# Patient Record
Sex: Female | Born: 1935 | Race: White | Hispanic: No | Marital: Single | Smoking: Never smoker
Health system: Southern US, Community
[De-identification: ages and names within clinical notes are randomized; demographics above are authoritative.]

## PROBLEM LIST (undated history)

## (undated) DIAGNOSIS — K219 Gastro-esophageal reflux disease without esophagitis: Secondary | ICD-10-CM

## (undated) DIAGNOSIS — F0390 Unspecified dementia without behavioral disturbance: Secondary | ICD-10-CM

---

## 2003-08-16 ENCOUNTER — Emergency Department (HOSPITAL_COMMUNITY): Admission: EM | Admit: 2003-08-16 | Discharge: 2003-08-16 | Payer: Self-pay | Admitting: Emergency Medicine

## 2010-09-10 ENCOUNTER — Ambulatory Visit (INDEPENDENT_AMBULATORY_CARE_PROVIDER_SITE_OTHER): Payer: Medicare Other | Admitting: Otolaryngology

## 2010-09-10 DIAGNOSIS — H6980 Other specified disorders of Eustachian tube, unspecified ear: Secondary | ICD-10-CM

## 2010-09-10 DIAGNOSIS — H902 Conductive hearing loss, unspecified: Secondary | ICD-10-CM

## 2010-09-10 DIAGNOSIS — J343 Hypertrophy of nasal turbinates: Secondary | ICD-10-CM

## 2010-09-10 DIAGNOSIS — J310 Chronic rhinitis: Secondary | ICD-10-CM

## 2010-09-10 DIAGNOSIS — H6520 Chronic serous otitis media, unspecified ear: Secondary | ICD-10-CM

## 2011-03-17 DIAGNOSIS — M79609 Pain in unspecified limb: Secondary | ICD-10-CM | POA: Diagnosis not present

## 2011-03-17 DIAGNOSIS — B351 Tinea unguium: Secondary | ICD-10-CM | POA: Diagnosis not present

## 2011-05-26 DIAGNOSIS — B351 Tinea unguium: Secondary | ICD-10-CM | POA: Diagnosis not present

## 2011-05-26 DIAGNOSIS — M79609 Pain in unspecified limb: Secondary | ICD-10-CM | POA: Diagnosis not present

## 2011-06-22 DIAGNOSIS — J309 Allergic rhinitis, unspecified: Secondary | ICD-10-CM | POA: Diagnosis not present

## 2011-07-13 DIAGNOSIS — N39498 Other specified urinary incontinence: Secondary | ICD-10-CM | POA: Diagnosis not present

## 2011-08-03 DIAGNOSIS — B351 Tinea unguium: Secondary | ICD-10-CM | POA: Diagnosis not present

## 2011-08-03 DIAGNOSIS — M79609 Pain in unspecified limb: Secondary | ICD-10-CM | POA: Diagnosis not present

## 2011-08-30 DIAGNOSIS — N39498 Other specified urinary incontinence: Secondary | ICD-10-CM | POA: Diagnosis not present

## 2011-09-21 DIAGNOSIS — F0280 Dementia in other diseases classified elsewhere without behavioral disturbance: Secondary | ICD-10-CM | POA: Diagnosis not present

## 2011-09-21 DIAGNOSIS — D649 Anemia, unspecified: Secondary | ICD-10-CM | POA: Diagnosis not present

## 2011-09-21 DIAGNOSIS — E7800 Pure hypercholesterolemia, unspecified: Secondary | ICD-10-CM | POA: Diagnosis not present

## 2011-09-21 DIAGNOSIS — N39498 Other specified urinary incontinence: Secondary | ICD-10-CM | POA: Diagnosis not present

## 2011-10-05 DIAGNOSIS — M79609 Pain in unspecified limb: Secondary | ICD-10-CM | POA: Diagnosis not present

## 2011-10-05 DIAGNOSIS — B351 Tinea unguium: Secondary | ICD-10-CM | POA: Diagnosis not present

## 2011-10-26 DIAGNOSIS — N39498 Other specified urinary incontinence: Secondary | ICD-10-CM | POA: Diagnosis not present

## 2011-11-12 DIAGNOSIS — Z23 Encounter for immunization: Secondary | ICD-10-CM | POA: Diagnosis not present

## 2011-11-19 DIAGNOSIS — N39498 Other specified urinary incontinence: Secondary | ICD-10-CM | POA: Diagnosis not present

## 2011-12-13 DIAGNOSIS — N39498 Other specified urinary incontinence: Secondary | ICD-10-CM | POA: Diagnosis not present

## 2011-12-13 DIAGNOSIS — F0280 Dementia in other diseases classified elsewhere without behavioral disturbance: Secondary | ICD-10-CM | POA: Diagnosis not present

## 2011-12-16 DIAGNOSIS — M79609 Pain in unspecified limb: Secondary | ICD-10-CM | POA: Diagnosis not present

## 2011-12-16 DIAGNOSIS — B351 Tinea unguium: Secondary | ICD-10-CM | POA: Diagnosis not present

## 2011-12-23 DIAGNOSIS — L298 Other pruritus: Secondary | ICD-10-CM | POA: Diagnosis not present

## 2011-12-23 DIAGNOSIS — E119 Type 2 diabetes mellitus without complications: Secondary | ICD-10-CM | POA: Diagnosis not present

## 2012-02-10 DIAGNOSIS — N39498 Other specified urinary incontinence: Secondary | ICD-10-CM | POA: Diagnosis not present

## 2012-03-02 DIAGNOSIS — B351 Tinea unguium: Secondary | ICD-10-CM | POA: Diagnosis not present

## 2012-03-02 DIAGNOSIS — M79609 Pain in unspecified limb: Secondary | ICD-10-CM | POA: Diagnosis not present

## 2012-03-13 DIAGNOSIS — R32 Unspecified urinary incontinence: Secondary | ICD-10-CM | POA: Diagnosis not present

## 2012-03-23 DIAGNOSIS — L259 Unspecified contact dermatitis, unspecified cause: Secondary | ICD-10-CM | POA: Diagnosis not present

## 2012-04-04 DIAGNOSIS — F0280 Dementia in other diseases classified elsewhere without behavioral disturbance: Secondary | ICD-10-CM | POA: Diagnosis not present

## 2012-04-04 DIAGNOSIS — N390 Urinary tract infection, site not specified: Secondary | ICD-10-CM | POA: Diagnosis not present

## 2012-04-14 DIAGNOSIS — N39498 Other specified urinary incontinence: Secondary | ICD-10-CM | POA: Diagnosis not present

## 2012-05-15 DIAGNOSIS — B351 Tinea unguium: Secondary | ICD-10-CM | POA: Diagnosis not present

## 2012-05-15 DIAGNOSIS — M79609 Pain in unspecified limb: Secondary | ICD-10-CM | POA: Diagnosis not present

## 2012-06-22 DIAGNOSIS — F0280 Dementia in other diseases classified elsewhere without behavioral disturbance: Secondary | ICD-10-CM | POA: Diagnosis not present

## 2012-06-22 DIAGNOSIS — N39498 Other specified urinary incontinence: Secondary | ICD-10-CM | POA: Diagnosis not present

## 2012-07-06 DIAGNOSIS — L218 Other seborrheic dermatitis: Secondary | ICD-10-CM | POA: Diagnosis not present

## 2012-08-02 DIAGNOSIS — M79609 Pain in unspecified limb: Secondary | ICD-10-CM | POA: Diagnosis not present

## 2012-08-02 DIAGNOSIS — B351 Tinea unguium: Secondary | ICD-10-CM | POA: Diagnosis not present

## 2012-09-22 DIAGNOSIS — F0280 Dementia in other diseases classified elsewhere without behavioral disturbance: Secondary | ICD-10-CM | POA: Diagnosis not present

## 2012-09-22 DIAGNOSIS — N39498 Other specified urinary incontinence: Secondary | ICD-10-CM | POA: Diagnosis not present

## 2012-09-22 DIAGNOSIS — F410 Panic disorder [episodic paroxysmal anxiety] without agoraphobia: Secondary | ICD-10-CM | POA: Diagnosis not present

## 2012-10-18 DIAGNOSIS — M79609 Pain in unspecified limb: Secondary | ICD-10-CM | POA: Diagnosis not present

## 2012-10-18 DIAGNOSIS — B351 Tinea unguium: Secondary | ICD-10-CM | POA: Diagnosis not present

## 2012-11-03 DIAGNOSIS — D3130 Benign neoplasm of unspecified choroid: Secondary | ICD-10-CM | POA: Diagnosis not present

## 2012-11-03 DIAGNOSIS — H52229 Regular astigmatism, unspecified eye: Secondary | ICD-10-CM | POA: Diagnosis not present

## 2012-11-03 DIAGNOSIS — H2510 Age-related nuclear cataract, unspecified eye: Secondary | ICD-10-CM | POA: Diagnosis not present

## 2012-11-03 DIAGNOSIS — H5200 Hypermetropia, unspecified eye: Secondary | ICD-10-CM | POA: Diagnosis not present

## 2012-12-29 DIAGNOSIS — N39498 Other specified urinary incontinence: Secondary | ICD-10-CM | POA: Diagnosis not present

## 2012-12-29 DIAGNOSIS — F0280 Dementia in other diseases classified elsewhere without behavioral disturbance: Secondary | ICD-10-CM | POA: Diagnosis not present

## 2013-01-05 DIAGNOSIS — M79609 Pain in unspecified limb: Secondary | ICD-10-CM | POA: Diagnosis not present

## 2013-01-05 DIAGNOSIS — B351 Tinea unguium: Secondary | ICD-10-CM | POA: Diagnosis not present

## 2013-01-31 DIAGNOSIS — R32 Unspecified urinary incontinence: Secondary | ICD-10-CM | POA: Diagnosis not present

## 2013-02-26 DIAGNOSIS — N39498 Other specified urinary incontinence: Secondary | ICD-10-CM | POA: Diagnosis not present

## 2013-04-05 DIAGNOSIS — F0280 Dementia in other diseases classified elsewhere without behavioral disturbance: Secondary | ICD-10-CM | POA: Diagnosis not present

## 2013-04-05 DIAGNOSIS — N39498 Other specified urinary incontinence: Secondary | ICD-10-CM | POA: Diagnosis not present

## 2013-04-11 DIAGNOSIS — B351 Tinea unguium: Secondary | ICD-10-CM | POA: Diagnosis not present

## 2013-04-11 DIAGNOSIS — M79609 Pain in unspecified limb: Secondary | ICD-10-CM | POA: Diagnosis not present

## 2013-04-24 DIAGNOSIS — N39498 Other specified urinary incontinence: Secondary | ICD-10-CM | POA: Diagnosis not present

## 2013-07-04 DIAGNOSIS — N39498 Other specified urinary incontinence: Secondary | ICD-10-CM | POA: Diagnosis not present

## 2013-07-05 DIAGNOSIS — L259 Unspecified contact dermatitis, unspecified cause: Secondary | ICD-10-CM | POA: Diagnosis not present

## 2013-07-05 DIAGNOSIS — F0280 Dementia in other diseases classified elsewhere without behavioral disturbance: Secondary | ICD-10-CM | POA: Diagnosis not present

## 2013-07-09 DIAGNOSIS — M79609 Pain in unspecified limb: Secondary | ICD-10-CM | POA: Diagnosis not present

## 2013-07-09 DIAGNOSIS — B351 Tinea unguium: Secondary | ICD-10-CM | POA: Diagnosis not present

## 2013-07-10 DIAGNOSIS — IMO0002 Reserved for concepts with insufficient information to code with codable children: Secondary | ICD-10-CM | POA: Diagnosis not present

## 2013-07-12 DIAGNOSIS — F0280 Dementia in other diseases classified elsewhere without behavioral disturbance: Secondary | ICD-10-CM | POA: Diagnosis not present

## 2013-07-12 DIAGNOSIS — H268 Other specified cataract: Secondary | ICD-10-CM | POA: Diagnosis not present

## 2013-08-28 DIAGNOSIS — H2510 Age-related nuclear cataract, unspecified eye: Secondary | ICD-10-CM | POA: Diagnosis not present

## 2013-08-31 ENCOUNTER — Other Ambulatory Visit (HOSPITAL_COMMUNITY): Payer: Medicare Other

## 2013-09-12 ENCOUNTER — Other Ambulatory Visit (HOSPITAL_COMMUNITY): Payer: Medicare Other

## 2013-09-13 DIAGNOSIS — M79609 Pain in unspecified limb: Secondary | ICD-10-CM | POA: Diagnosis not present

## 2013-09-13 DIAGNOSIS — B351 Tinea unguium: Secondary | ICD-10-CM | POA: Diagnosis not present

## 2013-09-17 DIAGNOSIS — N39498 Other specified urinary incontinence: Secondary | ICD-10-CM | POA: Diagnosis not present

## 2013-09-26 ENCOUNTER — Other Ambulatory Visit (HOSPITAL_COMMUNITY): Payer: Medicare Other

## 2013-10-02 ENCOUNTER — Ambulatory Visit (HOSPITAL_COMMUNITY): Admission: RE | Admit: 2013-10-02 | Payer: Medicare Other | Source: Ambulatory Visit | Admitting: Ophthalmology

## 2013-10-02 ENCOUNTER — Encounter (HOSPITAL_COMMUNITY): Admission: RE | Payer: Self-pay | Source: Ambulatory Visit

## 2013-10-02 SURGERY — PHACOEMULSIFICATION, CATARACT, WITH IOL INSERTION
Anesthesia: Monitor Anesthesia Care | Laterality: Right

## 2013-10-04 DIAGNOSIS — F0280 Dementia in other diseases classified elsewhere without behavioral disturbance: Secondary | ICD-10-CM | POA: Diagnosis not present

## 2013-10-04 DIAGNOSIS — N39498 Other specified urinary incontinence: Secondary | ICD-10-CM | POA: Diagnosis not present

## 2013-10-09 ENCOUNTER — Other Ambulatory Visit (HOSPITAL_COMMUNITY): Payer: Medicare Other

## 2013-10-12 DIAGNOSIS — N39498 Other specified urinary incontinence: Secondary | ICD-10-CM | POA: Diagnosis not present

## 2013-10-16 ENCOUNTER — Encounter (HOSPITAL_COMMUNITY): Admission: RE | Payer: Self-pay | Source: Ambulatory Visit

## 2013-10-16 ENCOUNTER — Ambulatory Visit (HOSPITAL_COMMUNITY): Admission: RE | Admit: 2013-10-16 | Payer: Medicare Other | Source: Ambulatory Visit | Admitting: Ophthalmology

## 2013-10-16 SURGERY — PHACOEMULSIFICATION, CATARACT, WITH IOL INSERTION
Anesthesia: Monitor Anesthesia Care | Laterality: Left

## 2013-12-10 DIAGNOSIS — M79674 Pain in right toe(s): Secondary | ICD-10-CM | POA: Diagnosis not present

## 2013-12-10 DIAGNOSIS — M79671 Pain in right foot: Secondary | ICD-10-CM | POA: Diagnosis not present

## 2013-12-10 DIAGNOSIS — B351 Tinea unguium: Secondary | ICD-10-CM | POA: Diagnosis not present

## 2014-01-30 DIAGNOSIS — F0390 Unspecified dementia without behavioral disturbance: Secondary | ICD-10-CM | POA: Diagnosis not present

## 2014-01-30 DIAGNOSIS — R32 Unspecified urinary incontinence: Secondary | ICD-10-CM | POA: Diagnosis not present

## 2014-02-20 DIAGNOSIS — R32 Unspecified urinary incontinence: Secondary | ICD-10-CM | POA: Diagnosis not present

## 2014-02-20 DIAGNOSIS — F0390 Unspecified dementia without behavioral disturbance: Secondary | ICD-10-CM | POA: Diagnosis not present

## 2014-03-27 DIAGNOSIS — B351 Tinea unguium: Secondary | ICD-10-CM | POA: Diagnosis not present

## 2014-03-27 DIAGNOSIS — M79674 Pain in right toe(s): Secondary | ICD-10-CM | POA: Diagnosis not present

## 2014-03-27 DIAGNOSIS — M79675 Pain in left toe(s): Secondary | ICD-10-CM | POA: Diagnosis not present

## 2014-04-15 DIAGNOSIS — L139 Bullous disorder, unspecified: Secondary | ICD-10-CM | POA: Diagnosis not present

## 2014-05-02 DIAGNOSIS — L139 Bullous disorder, unspecified: Secondary | ICD-10-CM | POA: Diagnosis not present

## 2014-05-02 DIAGNOSIS — R32 Unspecified urinary incontinence: Secondary | ICD-10-CM | POA: Diagnosis not present

## 2014-05-29 DIAGNOSIS — H25813 Combined forms of age-related cataract, bilateral: Secondary | ICD-10-CM | POA: Diagnosis not present

## 2014-06-05 DIAGNOSIS — L281 Prurigo nodularis: Secondary | ICD-10-CM | POA: Diagnosis not present

## 2014-06-05 DIAGNOSIS — B86 Scabies: Secondary | ICD-10-CM | POA: Diagnosis not present

## 2014-06-17 DIAGNOSIS — M79675 Pain in left toe(s): Secondary | ICD-10-CM | POA: Diagnosis not present

## 2014-06-17 DIAGNOSIS — M79674 Pain in right toe(s): Secondary | ICD-10-CM | POA: Diagnosis not present

## 2014-06-17 DIAGNOSIS — B351 Tinea unguium: Secondary | ICD-10-CM | POA: Diagnosis not present

## 2014-07-03 DIAGNOSIS — L139 Bullous disorder, unspecified: Secondary | ICD-10-CM | POA: Diagnosis not present

## 2014-07-03 DIAGNOSIS — R32 Unspecified urinary incontinence: Secondary | ICD-10-CM | POA: Diagnosis not present

## 2014-07-15 DIAGNOSIS — B86 Scabies: Secondary | ICD-10-CM | POA: Diagnosis not present

## 2014-09-10 DIAGNOSIS — M79674 Pain in right toe(s): Secondary | ICD-10-CM | POA: Diagnosis not present

## 2014-09-10 DIAGNOSIS — B351 Tinea unguium: Secondary | ICD-10-CM | POA: Diagnosis not present

## 2014-09-10 DIAGNOSIS — M79675 Pain in left toe(s): Secondary | ICD-10-CM | POA: Diagnosis not present

## 2014-09-11 DIAGNOSIS — R32 Unspecified urinary incontinence: Secondary | ICD-10-CM | POA: Diagnosis not present

## 2014-10-30 DIAGNOSIS — F0390 Unspecified dementia without behavioral disturbance: Secondary | ICD-10-CM | POA: Diagnosis not present

## 2014-10-30 DIAGNOSIS — R32 Unspecified urinary incontinence: Secondary | ICD-10-CM | POA: Diagnosis not present

## 2014-10-30 DIAGNOSIS — D649 Anemia, unspecified: Secondary | ICD-10-CM | POA: Diagnosis not present

## 2014-10-30 DIAGNOSIS — E781 Pure hyperglyceridemia: Secondary | ICD-10-CM | POA: Diagnosis not present

## 2014-10-30 DIAGNOSIS — L259 Unspecified contact dermatitis, unspecified cause: Secondary | ICD-10-CM | POA: Diagnosis not present

## 2014-10-30 DIAGNOSIS — T7840XA Allergy, unspecified, initial encounter: Secondary | ICD-10-CM | POA: Diagnosis not present

## 2014-10-30 DIAGNOSIS — F0391 Unspecified dementia with behavioral disturbance: Secondary | ICD-10-CM | POA: Diagnosis not present

## 2014-11-01 DIAGNOSIS — R32 Unspecified urinary incontinence: Secondary | ICD-10-CM | POA: Diagnosis not present

## 2014-11-01 DIAGNOSIS — Z23 Encounter for immunization: Secondary | ICD-10-CM | POA: Diagnosis not present

## 2014-11-07 DIAGNOSIS — R32 Unspecified urinary incontinence: Secondary | ICD-10-CM | POA: Diagnosis not present

## 2014-12-03 DIAGNOSIS — M79675 Pain in left toe(s): Secondary | ICD-10-CM | POA: Diagnosis not present

## 2014-12-03 DIAGNOSIS — M79674 Pain in right toe(s): Secondary | ICD-10-CM | POA: Diagnosis not present

## 2014-12-03 DIAGNOSIS — B351 Tinea unguium: Secondary | ICD-10-CM | POA: Diagnosis not present

## 2014-12-12 DIAGNOSIS — H25813 Combined forms of age-related cataract, bilateral: Secondary | ICD-10-CM | POA: Diagnosis not present

## 2014-12-15 ENCOUNTER — Emergency Department (HOSPITAL_COMMUNITY)
Admission: EM | Admit: 2014-12-15 | Discharge: 2014-12-15 | Disposition: A | Discharge status: Skilled Nursing Facility | Payer: Medicare Other | Attending: Emergency Medicine | Admitting: Emergency Medicine

## 2014-12-15 ENCOUNTER — Emergency Department (HOSPITAL_COMMUNITY): Payer: Medicare Other

## 2014-12-15 ENCOUNTER — Encounter (HOSPITAL_COMMUNITY): Payer: Self-pay | Admitting: Emergency Medicine

## 2014-12-15 DIAGNOSIS — S0990XA Unspecified injury of head, initial encounter: Secondary | ICD-10-CM

## 2014-12-15 DIAGNOSIS — M25522 Pain in left elbow: Secondary | ICD-10-CM | POA: Diagnosis not present

## 2014-12-15 DIAGNOSIS — S4992XA Unspecified injury of left shoulder and upper arm, initial encounter: Secondary | ICD-10-CM | POA: Diagnosis present

## 2014-12-15 DIAGNOSIS — T148 Other injury of unspecified body region: Secondary | ICD-10-CM | POA: Diagnosis not present

## 2014-12-15 DIAGNOSIS — S42432A Displaced fracture (avulsion) of lateral epicondyle of left humerus, initial encounter for closed fracture: Secondary | ICD-10-CM

## 2014-12-15 DIAGNOSIS — Y9389 Activity, other specified: Secondary | ICD-10-CM | POA: Insufficient documentation

## 2014-12-15 DIAGNOSIS — Y92129 Unspecified place in nursing home as the place of occurrence of the external cause: Secondary | ICD-10-CM | POA: Insufficient documentation

## 2014-12-15 DIAGNOSIS — S5002XA Contusion of left elbow, initial encounter: Secondary | ICD-10-CM

## 2014-12-15 DIAGNOSIS — Y998 Other external cause status: Secondary | ICD-10-CM | POA: Diagnosis not present

## 2014-12-15 DIAGNOSIS — F0390 Unspecified dementia without behavioral disturbance: Secondary | ICD-10-CM | POA: Insufficient documentation

## 2014-12-15 DIAGNOSIS — M25532 Pain in left wrist: Secondary | ICD-10-CM | POA: Diagnosis not present

## 2014-12-15 DIAGNOSIS — W01198A Fall on same level from slipping, tripping and stumbling with subsequent striking against other object, initial encounter: Secondary | ICD-10-CM | POA: Insufficient documentation

## 2014-12-15 DIAGNOSIS — R42 Dizziness and giddiness: Secondary | ICD-10-CM | POA: Diagnosis not present

## 2014-12-15 DIAGNOSIS — S59902A Unspecified injury of left elbow, initial encounter: Secondary | ICD-10-CM | POA: Diagnosis not present

## 2014-12-15 DIAGNOSIS — M25512 Pain in left shoulder: Secondary | ICD-10-CM | POA: Diagnosis not present

## 2014-12-15 HISTORY — DX: Unspecified dementia, unspecified severity, without behavioral disturbance, psychotic disturbance, mood disturbance, and anxiety: F03.90

## 2014-12-15 MED ORDER — APAP 325 MG PO TABS: 650.0000 mg | ORAL_TABLET | Freq: Four times a day (QID) | ORAL | Status: AC | PRN

## 2014-12-15 MED ORDER — ACETAMINOPHEN 500 MG PO TABS
1000.0000 mg | ORAL_TABLET | Freq: Once | ORAL | Status: AC
  Administered 2014-12-15: 1000 mg via ORAL
  Filled 2014-12-15: qty 2

## 2014-12-15 NOTE — ED Notes (Signed)
Patient being discharged. Report given to [facility], staff coming to transport patient back to facility.

## 2014-12-15 NOTE — ED Notes (Signed)
Pt from [facility] NH after fall. Per EMS, pt was getting up from toilet when she fell and landed in the tub on her L. Elbow.

## 2014-12-15 NOTE — Discharge Instructions (Signed)
You have a small avulsion fracture of the left elbow that will not need a cast or surgery.  You may apply ice to this 3-4 times a day for 10-15 minutes.  Keep your arm elevated above the level of your heart to prevent swelling down.  You may wear a sling for comfort.  The other xrays of your arm, head and cervical spine CTs showed no injury.  You may take Tylenol as prescribed for pain.   Elbow Contusion An elbow contusion is a deep bruise of the elbow. Contusions are the result of an injury that caused bleeding under the skin. The contusion may turn blue, purple, or yellow. Minor injuries will give you a painless contusion, but more severe contusions may stay painful and swollen for a few weeks.  CAUSES  An elbow contusion comes from a direct force to that area, such as falling on the elbow. SYMPTOMS   Swelling and redness of the elbow.  Bruising of the elbow area.  Tenderness or soreness of the elbow. DIAGNOSIS  You will have a physical exam and will be asked about your history. You may need an X-ray of your elbow to look for a broken bone (fracture).  TREATMENT  A sling or splint may be needed to support your injury. Resting, elevating, and applying cold compresses to the elbow area are often the best treatments for an elbow contusion. Over-the-counter medicines may also be recommended for pain control. HOME CARE INSTRUCTIONS   Put ice on the injured area.  Put ice in a plastic bag.  Place a towel between your skin and the bag.  Leave the ice on for 15-20 minutes, 03-04 times a day.  Only take over-the-counter or prescription medicines for pain, discomfort, or fever as directed by your caregiver.  Rest your injured elbow until the pain and swelling are better.  Elevate your elbow to reduce swelling.  Apply a compression wrap as directed by your caregiver. This can help reduce swelling and motion. You may remove the wrap for sleeping, showers, and baths. If your fingers become  numb, cold, or blue, take the wrap off and reapply it more loosely.  Use your elbow only as directed by your caregiver. You may be asked to do range of motion exercises. Do them as directed.  See your caregiver as directed. It is very important to keep all follow-up appointments in order to avoid any long-term problems with your elbow, including chronic pain or inability to move your elbow normally. SEEK IMMEDIATE MEDICAL CARE IF:   You have increased redness, swelling, or pain in your elbow.  Your swelling or pain is not relieved with medicines.  You have swelling of the hand and fingers.  You are unable to move your fingers or wrist.  You begin to lose feeling in your hand or fingers.  Your fingers or hand become cold or blue. MAKE SURE YOU:   Understand these instructions.  Will watch your condition.  Will get help right away if you are not doing well or get worse.   This information is not intended to replace advice given to you by your health care provider. Make sure you discuss any questions you have with your health care provider.   Document Released: 01/17/2006 Document Revised: 05/03/2011 Document Reviewed: 09/23/2014 Elsevier Interactive Patient Education 2016 [place] Injury, Adult You have a head injury. Headaches and throwing up (vomiting) are common after a head injury. It should be easy to wake up from  sleeping. Sometimes you must stay in the hospital. Most problems happen within the first 24 hours. Side effects may occur up to 7-10 days after the injury.  WHAT ARE THE TYPES OF HEAD INJURIES? Head injuries can be as minor as a bump. Some head injuries can be more severe. More severe head injuries include:  A jarring injury to the brain (concussion).  A bruise of the brain (contusion). This mean there is bleeding in the brain that can cause swelling.  A cracked skull (skull fracture).  Bleeding in the brain that collects, clots, and forms a bump  (hematoma). WHEN SHOULD I GET HELP RIGHT AWAY?   You are confused or sleepy.  You cannot be woken up.  You feel sick to your stomach (nauseous) or keep throwing up (vomiting).  Your dizziness or unsteadiness is getting worse.  You have very bad, lasting headaches that are not helped by medicine. Take medicines only as told by your doctor.  You cannot use your arms or legs like normal.  You cannot walk.  You notice changes in the black spots in the center of the colored part of your eye (pupil).  You have clear or bloody fluid coming from your nose or ears.  You have trouble seeing. During the next 24 hours after the injury, you must stay with someone who can watch you. This person should get help right away (call 911 in the [place] if you start to shake and are not able to control it (have seizures), you pass out, or you are unable to wake up. HOW CAN I PREVENT A HEAD INJURY IN THE FUTURE?  Wear seat belts.  Wear a helmet while bike riding and playing sports like football.  Stay away from dangerous activities around the house. WHEN CAN I RETURN TO NORMAL ACTIVITIES AND ATHLETICS? See your doctor before doing these activities. You should not do normal activities or play contact sports until 1 week after the following symptoms have stopped:  Headache that does not go away.  Dizziness.  Poor attention.  Confusion.  Memory problems.  Sickness to your stomach or throwing up.  Tiredness.  Fussiness.  Bothered by bright lights or loud noises.  Anxiousness or depression.  Restless sleep. MAKE SURE YOU:   Understand these instructions.  Will watch your condition.  Will get help right away if you are not doing well or get worse.   This information is not intended to replace advice given to you by your health care provider. Make sure you discuss any questions you have with your health care provider.   Document Released: 01/22/2008 Document Revised: 03/01/2014  Document Reviewed: 10/16/2012 Elsevier Interactive Patient Education [employer].

## 2014-12-15 NOTE — ED Provider Notes (Signed)
TIME SEEN: 5:00 AM  CHIEF COMPLAINT: Fall, left arm injury  HPI: Pt is a 81 y.o. female with history of dementia who lives at [facility] who presents emergency department with a fall from the toilet. States she struck her left arm on the tub. States she fell when trying to get up from the toilet. She thinks she hit her head. Denies neck or back pain. No numbness, tingling or focal weakness. Patient is on aspirin, no other anticoagulation or antiplatelet agent. Confirmed with MAR.  ROS: Level V caveat for dementia  PAST MEDICAL HISTORY/PAST SURGICAL HISTORY:  Past Medical History  Diagnosis Date  . Dementia     MEDICATIONS:  Prior to Admission medications   Not on File    ALLERGIES:  No Known Allergies  SOCIAL HISTORY:  Social History  Substance Use Topics  . Smoking status: Never Smoker   . Smokeless tobacco: Not on file  . Alcohol Use: Not on file    FAMILY HISTORY: History reviewed. No pertinent family history.  EXAM: BP 150/76 mmHg  Pulse 87  Temp(Src) 97.9 F (36.6 C) (Oral)  Resp 16  Ht 5\' 6"  (1.676 m)  Wt 110 lb (49.896 kg)  BMI 17.76 kg/m2  SpO2 97% CONSTITUTIONAL: Alert and oriented to person and will answer some questions appropriately. Elderly but in no distress. Very pleasant. HEAD: Normocephalic; atraumatic EYES: Conjunctivae clear, PERRL, EOMI ENT: normal nose; no rhinorrhea; moist mucous membranes; pharynx without lesions noted; no dental injury; no septal hematoma NECK: Supple, no meningismus, no LAD; no midline spinal tenderness, step-off or deformity CARD: RRR; S1 and S2 appreciated; no murmurs, no clicks, no rubs, no gallops RESP: Normal chest excursion without splinting or tachypnea; breath sounds clear and equal bilaterally; no wheezes, no rhonchi, no rales; no hypoxia or respiratory distress CHEST:  chest wall stable, no crepitus or ecchymosis or deformity, nontender to palpation ABD/GI: Normal bowel sounds; non-distended; soft,  non-tender, no rebound, no guarding PELVIS:  stable, nontender to palpation BACK:  The back appears normal and is non-tender to palpation, there is no CVA tenderness; no midline spinal tenderness, step-off or deformity EXT: Patient has a hematoma to the medial aspect of the left elbow but has full range of motion in the joint without bony deformity, tender to palpation over the left dorsal hand and left wrist diffusely without scaphoid tenderness, also tender over the left shoulder without bony deformity, Normal ROM in all joints; otherwise externally is are non-tender to palpation; no edema; normal capillary refill; no cyanosis, no bony tenderness or bony deformity of patient's extremities, no joint effusion, no ecchymosis or lacerations    SKIN: Normal color for age and race; warm NEURO: Moves all extremities equally, sensation to light touch intact diffusely, cranial nerves II through XII intact PSYCH: The patient's mood and manner are appropriate. Grooming and personal hygiene are appropriate.  MEDICAL DECISION MAKING: Patient here with mechanical fall when getting off the toilet. Complaining of left arm pain mostly in the left elbow. Will obtain x-rays of the left shoulder, left elbow, left wrist and left hand. We'll also obtain a CT of her head and cervical spine. We'll give Tylenol for pain control.  ED PROGRESS: CT of head and cervical spine shows no acute injury.  Xray of left elbow shows very small displaced internal epicondyle fragment of the distal humerus and associated hematoma.  No significant fracture and pt has full ROM in this joint.  Will place in sling for comfort and  recommend outpt follow up and Tylenol for pain.  Otherwise xrays normal.  Updated pt.  Will dc back to [employer] [employer].     [place], DO 12/15/14 [phone]

## 2014-12-23 DIAGNOSIS — R32 Unspecified urinary incontinence: Secondary | ICD-10-CM | POA: Diagnosis not present

## 2015-01-28 DIAGNOSIS — H2513 Age-related nuclear cataract, bilateral: Secondary | ICD-10-CM | POA: Diagnosis not present

## 2015-01-29 DIAGNOSIS — R32 Unspecified urinary incontinence: Secondary | ICD-10-CM | POA: Diagnosis not present

## 2015-01-29 DIAGNOSIS — E611 Iron deficiency: Secondary | ICD-10-CM | POA: Diagnosis not present

## 2015-01-29 DIAGNOSIS — G301 Alzheimer's disease with late onset: Secondary | ICD-10-CM | POA: Diagnosis not present

## 2015-01-30 DIAGNOSIS — Z23 Encounter for immunization: Secondary | ICD-10-CM | POA: Diagnosis not present

## 2015-02-21 NOTE — Patient Instructions (Signed)
Your procedure is scheduled on: 03/04/2015  Report to [facility] at  26  AM.  Call this number if you have problems the morning of surgery: [phone]   Do not eat food or drink liquids :After Midnight.      Take these medicines the morning of surgery with A SIP OF WATER: none   Do not wear jewelry, make-up or nail polish.  Do not wear lotions, powders, or perfumes. You may wear deodorant.  Do not shave 48 hours prior to surgery.  Do not bring valuables to the hospital.  Contacts, dentures or bridgework may not be worn into surgery.  Leave suitcase in the car. After surgery it may be brought to your room.  For patients admitted to the hospital, checkout time is 11:00 AM the day of discharge.   Patients discharged the day of surgery will not be allowed to drive home.  :     Please read over the following fact sheets that you were given: Coughing and Deep Breathing, Surgical Site Infection Prevention, Anesthesia Post-op Instructions and Care and Recovery After Surgery    Cataract A cataract is a clouding of the lens of the eye. When a lens becomes cloudy, vision is reduced based on the degree and nature of the clouding. Many cataracts reduce vision to some degree. Some cataracts make people more near-sighted as they develop. Other cataracts increase glare. Cataracts that are ignored and become worse can sometimes look white. The white color can be seen through the pupil. CAUSES   Aging. However, cataracts may occur at any age, even in newborns.   Certain drugs.   Trauma to the eye.   Certain diseases such as diabetes.   Specific eye diseases such as chronic inflammation inside the eye or a sudden attack of a rare form of glaucoma.   Inherited or acquired medical problems.  SYMPTOMS   Gradual, progressive drop in vision in the affected eye.   Severe, rapid visual loss. This most often happens when trauma is the cause.  DIAGNOSIS  To detect a cataract, an eye doctor examines  the lens. Cataracts are best diagnosed with an exam of the eyes with the pupils enlarged (dilated) by drops.  TREATMENT  For an early cataract, vision may improve by using different eyeglasses or stronger lighting. If that does not help your vision, surgery is the only effective treatment. A cataract needs to be surgically removed when vision loss interferes with your everyday activities, such as driving, reading, or watching TV. A cataract may also have to be removed if it prevents examination or treatment of another eye problem. Surgery removes the cloudy lens and usually replaces it with a substitute lens (intraocular lens, IOL).  At a time when both you and your doctor agree, the cataract will be surgically removed. If you have cataracts in both eyes, only one is usually removed at a time. This allows the operated eye to heal and be out of danger from any possible problems after surgery (such as infection or poor wound healing). In rare cases, a cataract may be doing damage to your eye. In these cases, your caregiver may advise surgical removal right away. The vast majority of people who have cataract surgery have better vision afterward. HOME CARE INSTRUCTIONS  If you are not planning surgery, you may be asked to do the following:  Use different eyeglasses.   Use stronger or brighter lighting.   Ask your eye doctor about reducing your medicine dose or  changing medicines if it is thought that a medicine caused your cataract. Changing medicines does not make the cataract go away on its own.   Become familiar with your surroundings. Poor vision can lead to injury. Avoid bumping into things on the affected side. You are at a higher risk for tripping or falling.   Exercise extreme care when driving or operating machinery.   Wear sunglasses if you are sensitive to bright light or experiencing problems with glare.  SEEK IMMEDIATE MEDICAL CARE IF:   You have a worsening or sudden vision loss.    You notice redness, swelling, or increasing pain in the eye.   You have a fever.  Document Released: 02/08/2005 Document Revised: 01/28/2011 Document Reviewed: 10/02/2010 [facility] Patient Information 2012 [place].PATIENT INSTRUCTIONS POST-ANESTHESIA  IMMEDIATELY FOLLOWING SURGERY:  Do not drive or operate machinery for the first twenty four hours after surgery.  Do not make any important decisions for twenty four hours after surgery or while taking narcotic pain medications or sedatives.  If you develop intractable nausea and vomiting or a severe headache please notify your doctor immediately.  FOLLOW-UP:  Please make an appointment with your surgeon as instructed. You do not need to follow up with anesthesia unless specifically instructed to do so.  WOUND CARE INSTRUCTIONS (if applicable):  Keep a dry clean dressing on the anesthesia/puncture wound site if there is drainage.  Once the wound has quit draining you may leave it open to air.  Generally you should leave the bandage intact for twenty four hours unless there is drainage.  If the epidural site drains for more than 36-48 hours please call the anesthesia department.  QUESTIONS?:  Please feel free to call your physician or the hospital operator if you have any questions, and they will be happy to assist you.

## 2015-02-25 ENCOUNTER — Encounter (HOSPITAL_COMMUNITY): Payer: Self-pay

## 2015-02-25 ENCOUNTER — Other Ambulatory Visit: Payer: Self-pay

## 2015-02-25 ENCOUNTER — Encounter (HOSPITAL_COMMUNITY)
Admission: RE | Admit: 2015-02-25 | Discharge: 2015-02-25 | Disposition: A | Discharge status: Home / Self Care | Payer: Medicare Other | Source: Ambulatory Visit | Attending: Ophthalmology | Admitting: Ophthalmology

## 2015-02-25 DIAGNOSIS — H2511 Age-related nuclear cataract, right eye: Secondary | ICD-10-CM | POA: Diagnosis not present

## 2015-02-25 DIAGNOSIS — Z01818 Encounter for other preprocedural examination: Secondary | ICD-10-CM | POA: Diagnosis not present

## 2015-02-25 HISTORY — DX: Gastro-esophageal reflux disease without esophagitis: K21.9

## 2015-02-25 LAB — BASIC METABOLIC PANEL
ANION GAP: 8 (ref 5–15)
BUN: 15 mg/dL (ref 6–20)
CALCIUM: 9.5 mg/dL (ref 8.9–10.3)
CO2: 27 mmol/L (ref 22–32)
Chloride: 104 mmol/L (ref 101–111)
Creatinine, Ser: 0.75 mg/dL (ref 0.44–1.00)
GLUCOSE: 92 mg/dL (ref 65–99)
POTASSIUM: 4.3 mmol/L (ref 3.5–5.1)
SODIUM: 139 mmol/L (ref 135–145)

## 2015-02-25 LAB — CBC WITH DIFFERENTIAL/PLATELET
BASOS ABS: 0 10*3/uL (ref 0.0–0.1)
BASOS PCT: 1 %
EOS ABS: 0.4 10*3/uL (ref 0.0–0.7)
EOS PCT: 6 %
HCT: 39.5 % (ref 36.0–46.0)
Hemoglobin: 13 g/dL (ref 12.0–15.0)
LYMPHS PCT: 25 %
Lymphs Abs: 1.7 10*3/uL (ref 0.7–4.0)
MCH: 29.7 pg (ref 26.0–34.0)
MCHC: 32.9 g/dL (ref 30.0–36.0)
MCV: 90.2 fL (ref 78.0–100.0)
MONO ABS: 0.9 10*3/uL (ref 0.1–1.0)
Monocytes Relative: 14 %
Neutro Abs: 3.6 10*3/uL (ref 1.7–7.7)
Neutrophils Relative %: 54 %
PLATELETS: 285 10*3/uL (ref 150–400)
RBC: 4.38 MIL/uL (ref 3.87–5.11)
RDW: 17 % — AB (ref 11.5–15.5)
WBC: 6.6 10*3/uL (ref 4.0–10.5)

## 2015-02-25 NOTE — Pre-Procedure Instructions (Signed)
Patient from [facility] and accompanied by staff member. Patient oriented to person only.She has caseworker, [employer], who is not here today. She was contacted by [employer] so she can come sign consent.

## 2015-02-28 DIAGNOSIS — R32 Unspecified urinary incontinence: Secondary | ICD-10-CM | POA: Diagnosis not present

## 2015-03-03 MED ORDER — KETOROLAC TROMETHAMINE 0.5 % OP SOLN
OPHTHALMIC | Status: AC
  Filled 2015-03-03: qty 5

## 2015-03-03 MED ORDER — PHENYLEPHRINE HCL 2.5 % OP SOLN
OPHTHALMIC | Status: AC
  Filled 2015-03-03: qty 15

## 2015-03-03 MED ORDER — CYCLOPENTOLATE-PHENYLEPHRINE OP SOLN OPTIME - NO CHARGE
OPHTHALMIC | Status: AC
  Filled 2015-03-03: qty 2

## 2015-03-03 MED ORDER — TETRACAINE HCL 0.5 % OP SOLN
OPHTHALMIC | Status: AC
  Filled 2015-03-03: qty 4

## 2015-03-04 ENCOUNTER — Ambulatory Visit (HOSPITAL_COMMUNITY)
Admission: RE | Admit: 2015-03-04 | Discharge: 2015-03-04 | Disposition: A | Discharge status: Home / Self Care | Payer: Medicare Other | Source: Ambulatory Visit | Attending: Ophthalmology | Admitting: Ophthalmology

## 2015-03-04 ENCOUNTER — Encounter (HOSPITAL_COMMUNITY)
Admission: RE | Disposition: A | Discharge status: Home / Self Care | Payer: Self-pay | Source: Ambulatory Visit | Attending: Ophthalmology

## 2015-03-04 ENCOUNTER — Encounter (HOSPITAL_COMMUNITY): Payer: Self-pay | Admitting: *Deleted

## 2015-03-04 ENCOUNTER — Ambulatory Visit (HOSPITAL_COMMUNITY): Payer: Medicare Other | Admitting: Anesthesiology

## 2015-03-04 DIAGNOSIS — H269 Unspecified cataract: Secondary | ICD-10-CM | POA: Diagnosis not present

## 2015-03-04 DIAGNOSIS — Z79899 Other long term (current) drug therapy: Secondary | ICD-10-CM | POA: Insufficient documentation

## 2015-03-04 DIAGNOSIS — H2511 Age-related nuclear cataract, right eye: Secondary | ICD-10-CM | POA: Insufficient documentation

## 2015-03-04 DIAGNOSIS — F0390 Unspecified dementia without behavioral disturbance: Secondary | ICD-10-CM | POA: Insufficient documentation

## 2015-03-04 DIAGNOSIS — K219 Gastro-esophageal reflux disease without esophagitis: Secondary | ICD-10-CM | POA: Insufficient documentation

## 2015-03-04 DIAGNOSIS — Z7982 Long term (current) use of aspirin: Secondary | ICD-10-CM | POA: Insufficient documentation

## 2015-03-04 HISTORY — PX: CATARACT EXTRACTION W/PHACO: SHX586

## 2015-03-04 SURGERY — PHACOEMULSIFICATION, CATARACT, WITH IOL INSERTION
Anesthesia: Monitor Anesthesia Care | Site: Eye | Laterality: Right

## 2015-03-04 MED ORDER — PROVISC 10 MG/ML IO SOLN
INTRAOCULAR | Status: DC | PRN
  Administered 2015-03-04: 0.85 mL via INTRAOCULAR

## 2015-03-04 MED ORDER — MIDAZOLAM HCL 2 MG/2ML IJ SOLN
INTRAMUSCULAR | Status: AC
  Filled 2015-03-04: qty 2

## 2015-03-04 MED ORDER — EPINEPHRINE HCL 1 MG/ML IJ SOLN
INTRAMUSCULAR | Status: AC
  Filled 2015-03-04: qty 1

## 2015-03-04 MED ORDER — LACTATED RINGERS IV SOLN
INTRAVENOUS | Status: DC
  Administered 2015-03-04: 10:00:00 via INTRAVENOUS

## 2015-03-04 MED ORDER — MIDAZOLAM HCL 5 MG/5ML IJ SOLN
INTRAMUSCULAR | Status: DC | PRN
  Administered 2015-03-04: 1 mg via INTRAVENOUS

## 2015-03-04 MED ORDER — PHENYLEPHRINE HCL 2.5 % OP SOLN
1.0000 [drp] | OPHTHALMIC | Status: AC
  Administered 2015-03-04 (×3): 1 [drp] via OPHTHALMIC

## 2015-03-04 MED ORDER — KETOROLAC TROMETHAMINE 0.5 % OP SOLN
1.0000 [drp] | OPHTHALMIC | Status: AC
  Administered 2015-03-04 (×3): 1 [drp] via OPHTHALMIC

## 2015-03-04 MED ORDER — FENTANYL CITRATE (PF) 100 MCG/2ML IJ SOLN
INTRAMUSCULAR | Status: AC
  Filled 2015-03-04: qty 2

## 2015-03-04 MED ORDER — CYCLOPENTOLATE-PHENYLEPHRINE 0.2-1 % OP SOLN
1.0000 [drp] | OPHTHALMIC | Status: AC
  Administered 2015-03-04 (×3): 1 [drp] via OPHTHALMIC

## 2015-03-04 MED ORDER — MIDAZOLAM HCL 2 MG/2ML IJ SOLN
1.0000 mg | INTRAMUSCULAR | Status: DC | PRN
  Administered 2015-03-04: 2 mg via INTRAVENOUS

## 2015-03-04 MED ORDER — BSS IO SOLN
INTRAOCULAR | Status: DC | PRN
  Administered 2015-03-04: 15 mL

## 2015-03-04 MED ORDER — FENTANYL CITRATE (PF) 100 MCG/2ML IJ SOLN
25.0000 ug | INTRAMUSCULAR | Status: AC
  Administered 2015-03-04: 25 ug via INTRAVENOUS

## 2015-03-04 MED ORDER — TETRACAINE 0.5 % OP SOLN OPTIME - NO CHARGE
OPHTHALMIC | Status: DC | PRN
  Administered 2015-03-04: 1 [drp] via OPHTHALMIC

## 2015-03-04 MED ORDER — BSS IO SOLN
INTRAOCULAR | Status: DC | PRN
  Administered 2015-03-04: 500 mL

## 2015-03-04 MED ORDER — TETRACAINE HCL 0.5 % OP SOLN
1.0000 [drp] | OPHTHALMIC | Status: AC
  Administered 2015-03-04 (×3): 1 [drp] via OPHTHALMIC

## 2015-03-04 SURGICAL SUPPLY — 11 items
CLOTH BEACON ORANGE TIMEOUT ST (SAFETY) ×3 IMPLANT
EYE SHIELD UNIVERSAL CLEAR (GAUZE/BANDAGES/DRESSINGS) ×3 IMPLANT
GLOVE BIOGEL PI IND STRL 6.5 (GLOVE) ×1 IMPLANT
GLOVE BIOGEL PI IND STRL 7.0 (GLOVE) ×1 IMPLANT
GLOVE BIOGEL PI INDICATOR 6.5 (GLOVE) ×2
GLOVE BIOGEL PI INDICATOR 7.0 (GLOVE) ×2
LENS ALC ACRYL/TECN (Ophthalmic Related) ×3 IMPLANT
PAD ARMBOARD 7.5X6 YLW CONV (MISCELLANEOUS) ×3 IMPLANT
TAPE SURG TRANSPORE 1 IN (GAUZE/BANDAGES/DRESSINGS) ×1 IMPLANT
TAPE SURGICAL TRANSPORE 1 IN (GAUZE/BANDAGES/DRESSINGS) ×2
WATER STERILE IRR 250ML POUR (IV SOLUTION) ×3 IMPLANT

## 2015-03-04 NOTE — Transfer of Care (Signed)
Immediate Anesthesia Transfer of Care Note  Patient: Judy Stevenson  Procedure(s) Performed: Procedure(s) with comments: CATARACT EXTRACTION PHACO AND INTRAOCULAR LENS PLACEMENT (IOC) (Right) - CDE:45.80  Patient Location: Short Stay  Anesthesia Type:MAC  Level of Consciousness: awake and alert   Airway & Oxygen Therapy: Patient Spontanous Breathing  Post-op Assessment: Report given to RN  Post vital signs: Reviewed  Last Vitals:  Filed Vitals:   03/04/15 0955 03/04/15 1000  BP: 137/85 137/85  Temp:    Resp: 28 30    Complications: No apparent anesthesia complications

## 2015-03-04 NOTE — H&P (Signed)
The patient was re examined and there is no change in the patients condition since the original H and P. 

## 2015-03-04 NOTE — Anesthesia Preprocedure Evaluation (Signed)
Anesthesia Evaluation  Patient identified by MRN, date of birth, ID band Patient confused    Reviewed: Allergy & Precautions, NPO status , Patient's Chart, lab work & pertinent test results  Airway Mallampati: II  TM Distance: >3 FB     Dental  (+) Teeth Intact   Pulmonary neg pulmonary ROS,    breath sounds clear to auscultation       Cardiovascular negative cardio ROS   Rhythm:Regular Rate:Normal     Neuro/Psych PSYCHIATRIC DISORDERS (dementia)    GI/Hepatic GERD  ,  Endo/Other    Renal/GU      Musculoskeletal   Abdominal   Peds  Hematology   Anesthesia Other Findings   Reproductive/Obstetrics                             Anesthesia Physical Anesthesia Plan  ASA: III  Anesthesia Plan: MAC   Post-op Pain Management:    Induction: Intravenous  Airway Management Planned: Nasal Cannula  Additional Equipment:   Intra-op Plan:   Post-operative Plan:   Informed Consent: I have reviewed the patients History and Physical, chart, labs and discussed the procedure including the risks, benefits and alternatives for the proposed anesthesia with the patient or authorized representative who has indicated his/her understanding and acceptance.     Plan Discussed with:   Anesthesia Plan Comments:         Anesthesia Quick Evaluation

## 2015-03-04 NOTE — Anesthesia Postprocedure Evaluation (Signed)
Anesthesia Post Note  Patient: Judy Stevenson  Procedure(s) Performed: Procedure(s) (LRB): CATARACT EXTRACTION PHACO AND INTRAOCULAR LENS PLACEMENT (IOC) (Right)  Patient location during evaluation: Short Stay Anesthesia Type: MAC Level of consciousness: awake Pain management: pain level controlled Vital Signs Assessment: post-procedure vital signs reviewed and stable Respiratory status: spontaneous breathing Cardiovascular status: blood pressure returned to baseline Postop Assessment: no signs of nausea or vomiting Anesthetic complications: no    Last Vitals:  Filed Vitals:   03/04/15 0955 03/04/15 1000  BP: 137/85 137/85  Temp:    Resp: 28 30    Last Pain: There were no vitals filed for this visit.               Sayed Apostol

## 2015-03-04 NOTE — Op Note (Signed)
Patient brought to the operating room and prepped and draped in the usual manner.  Lid speculum inserted in right eye.  Stab incision made at the twelve o'clock position.  Provisc instilled in the anterior chamber.   A 2.4 mm. Stab incision was made temporally.  An anterior capsulotomy was done with a bent 25 gauge needle.  The nucleus was hydrodissected.  The Phaco tip was inserted in the anterior chamber and the nucleus was emulsified.  CDE was 45.80.  The cortical material was then removed with the I and A tip.  Posterior capsule was the polished.  The anterior chamber was deepened with Provisc.  A 20.5 Diopter Alcon [id number] IOL was then inserted in the capsular bag.  Provisc was then removed with the I and A tip.  The wound was then hydrated.  Patient sent to the Recovery Room in good condition with follow up in my office.  Preoperative Diagnosis:  Nuclear Cataract OD Postoperative Diagnosis:  Same Procedure name: Kelman Phacoemulsification OD with IOL

## 2015-03-04 NOTE — Discharge Instructions (Signed)
°  °          [facility] Instructions °[address]- [place] [address]-[place] °    ° °1. Avoid closing eyes tightly. One often closes the eye tightly when laughing, talking, sneezing, coughing or if they feel irritated. At these times, you should be careful not to close your eyes tightly. ° °2. Instill eye drops as instructed. To instill drops in your eye, open it, look up and have someone gently pull the lower lid down and instill a couple of drops inside the lower lid. ° °3. Do not touch upper lid. ° °4. Take Advil or Tylenol for pain. ° °5. You may use either eye for near work, such as reading or sewing and you may watch television. ° °6. You may have your hair done at the beauty parlor at any time. ° °7. Wear dark glasses with or without your own glasses if you are in bright light. ° °8. Call our office at [phone] or [phone] if you have sharp pain in your eye or unusual symptoms. ° °9.  FOLLOW UP WITH DR. SHAPIRO TODAY IN HIS [facility] OFFICE AT 2:45pm. ° °  °I have received a copy of the above instructions and will follow them.  ° ° ° °IF YOU ARE IN IMMEDIATE DANGER CALL 911! ° °It is important for you to keep your follow-up appointment with your physician after discharge, OR, for you /your caregiver to make a follow-up appointment with your physician / medical provider after discharge. ° °Show these instructions to the next healthcare provider you see. ° Anesthesia, Adult, Care After °Refer to this sheet in the next few weeks. These instructions provide you with information on caring for yourself after your procedure. Your health care provider may also give you more specific instructions. Your treatment has been planned according to current medical practices, but problems sometimes occur. Call your health care provider if you have any problems or questions after your procedure. °WHAT TO EXPECT AFTER THE PROCEDURE °After the procedure, it is typical to  experience: °· Sleepiness. °· Nausea and vomiting. °HOME CARE INSTRUCTIONS °· For the first 24 hours after general anesthesia: °¨ Have a responsible person with you. °¨ Do not drive a car. If you are alone, do not take public transportation. °¨ Do not drink alcohol. °¨ Do not take medicine that has not been prescribed by your health care provider. °¨ Do not sign important papers or make important decisions. °¨ You may resume a normal diet and activities as directed by your health care provider. °· Change bandages (dressings) as directed. °· If you have questions or problems that seem related to general anesthesia, call the hospital and ask for the anesthetist or anesthesiologist on call. °SEEK MEDICAL CARE IF: °· You have nausea and vomiting that continue the day after anesthesia. °· You develop a rash. °SEEK IMMEDIATE MEDICAL CARE IF:  °· You have difficulty breathing. °· You have chest pain. °· You have any allergic problems. °  °This information is not intended to replace advice given to you by your health care provider. Make sure you discuss any questions you have with your health care provider. °  °Document Released: 05/17/2000 Document Revised: 03/01/2014 Document Reviewed: 06/09/2011 °Elsevier Interactive Patient Education ©2016 [employer] ° °

## 2015-03-05 ENCOUNTER — Encounter (HOSPITAL_COMMUNITY): Payer: Self-pay | Admitting: Ophthalmology

## 2015-03-11 ENCOUNTER — Encounter (HOSPITAL_COMMUNITY)
Admission: RE | Admit: 2015-03-11 | Discharge: 2015-03-11 | Disposition: A | Discharge status: Home / Self Care | Payer: Medicare Other | Source: Ambulatory Visit | Attending: Ophthalmology | Admitting: Ophthalmology

## 2015-03-11 DIAGNOSIS — B351 Tinea unguium: Secondary | ICD-10-CM | POA: Diagnosis not present

## 2015-03-11 DIAGNOSIS — M79674 Pain in right toe(s): Secondary | ICD-10-CM | POA: Diagnosis not present

## 2015-03-11 DIAGNOSIS — H2512 Age-related nuclear cataract, left eye: Secondary | ICD-10-CM | POA: Diagnosis not present

## 2015-03-11 DIAGNOSIS — M79675 Pain in left toe(s): Secondary | ICD-10-CM | POA: Diagnosis not present

## 2015-03-17 MED ORDER — CYCLOPENTOLATE-PHENYLEPHRINE OP SOLN OPTIME - NO CHARGE
OPHTHALMIC | Status: AC
  Filled 2015-03-17: qty 2

## 2015-03-17 MED ORDER — PHENYLEPHRINE HCL 2.5 % OP SOLN
OPHTHALMIC | Status: AC
  Filled 2015-03-17: qty 15

## 2015-03-17 MED ORDER — TETRACAINE HCL 0.5 % OP SOLN
OPHTHALMIC | Status: AC
  Filled 2015-03-17: qty 4

## 2015-03-17 MED ORDER — KETOROLAC TROMETHAMINE 0.5 % OP SOLN
OPHTHALMIC | Status: AC
  Filled 2015-03-17: qty 5

## 2015-03-18 ENCOUNTER — Encounter (HOSPITAL_COMMUNITY): Payer: Self-pay | Admitting: Anesthesiology

## 2015-03-18 ENCOUNTER — Ambulatory Visit (HOSPITAL_COMMUNITY)
Admission: RE | Admit: 2015-03-18 | Discharge: 2015-03-18 | Disposition: A | Discharge status: Skilled Nursing Facility | Payer: Medicare Other | Source: Ambulatory Visit | Attending: Ophthalmology | Admitting: Ophthalmology

## 2015-03-18 ENCOUNTER — Encounter (HOSPITAL_COMMUNITY)
Admission: RE | Disposition: A | Discharge status: Skilled Nursing Facility | Payer: Self-pay | Source: Ambulatory Visit | Attending: Ophthalmology

## 2015-03-18 DIAGNOSIS — H2512 Age-related nuclear cataract, left eye: Secondary | ICD-10-CM | POA: Diagnosis not present

## 2015-03-18 SURGERY — PHACOEMULSIFICATION, CATARACT, WITH IOL INSERTION
Anesthesia: Monitor Anesthesia Care | Laterality: Left

## 2015-03-18 NOTE — OR Nursing (Signed)
Refusing to have procedure.  Dr. Gershon Crane in to talk with patient . She still refuses to have procedure.  Case cancelled per Dr. Gershon Crane.

## 2015-03-18 NOTE — Anesthesia Preprocedure Evaluation (Deleted)
Anesthesia Evaluation  Patient identified by MRN, date of birth, ID band Patient confused    Reviewed: Allergy & Precautions, NPO status , Patient's Chart, lab work & pertinent test results  Airway Mallampati: II  TM Distance: >3 FB     Dental  (+) Teeth Intact   Pulmonary neg pulmonary ROS,    breath sounds clear to auscultation       Cardiovascular negative cardio ROS   Rhythm:Regular Rate:Normal     Neuro/Psych PSYCHIATRIC DISORDERS (dementia)    GI/Hepatic GERD  ,  Endo/Other    Renal/GU      Musculoskeletal   Abdominal   Peds  Hematology   Anesthesia Other Findings   Reproductive/Obstetrics                             Anesthesia Physical Anesthesia Plan  ASA: III  Anesthesia Plan: MAC   Post-op Pain Management:    Induction: Intravenous  Airway Management Planned: Nasal Cannula  Additional Equipment:   Intra-op Plan:   Post-operative Plan:   Informed Consent: I have reviewed the patients History and Physical, chart, labs and discussed the procedure including the risks, benefits and alternatives for the proposed anesthesia with the patient or authorized representative who has indicated his/her understanding and acceptance.     Plan Discussed with: CRNA  Anesthesia Plan Comments: (Retrobulbar block by surgeon.)        Anesthesia Quick Evaluation

## 2015-03-18 NOTE — H&P (Signed)
The patient was re examined and there is no change in the patients condition since the original H and P. 

## 2015-05-07 DIAGNOSIS — F0390 Unspecified dementia without behavioral disturbance: Secondary | ICD-10-CM | POA: Diagnosis not present

## 2015-05-07 DIAGNOSIS — R32 Unspecified urinary incontinence: Secondary | ICD-10-CM | POA: Diagnosis not present

## 2015-05-07 DIAGNOSIS — D649 Anemia, unspecified: Secondary | ICD-10-CM | POA: Diagnosis not present

## 2015-05-26 DIAGNOSIS — R32 Unspecified urinary incontinence: Secondary | ICD-10-CM | POA: Diagnosis not present

## 2015-06-04 DIAGNOSIS — R8290 Unspecified abnormal findings in urine: Secondary | ICD-10-CM | POA: Diagnosis not present

## 2015-06-17 DIAGNOSIS — R32 Unspecified urinary incontinence: Secondary | ICD-10-CM | POA: Diagnosis not present

## 2015-06-19 DIAGNOSIS — H25813 Combined forms of age-related cataract, bilateral: Secondary | ICD-10-CM | POA: Diagnosis not present

## 2015-06-25 DIAGNOSIS — B351 Tinea unguium: Secondary | ICD-10-CM | POA: Diagnosis not present

## 2015-06-25 DIAGNOSIS — M79675 Pain in left toe(s): Secondary | ICD-10-CM | POA: Diagnosis not present

## 2015-06-25 DIAGNOSIS — M79674 Pain in right toe(s): Secondary | ICD-10-CM | POA: Diagnosis not present

## 2015-08-05 DIAGNOSIS — F0390 Unspecified dementia without behavioral disturbance: Secondary | ICD-10-CM | POA: Diagnosis not present

## 2015-08-05 DIAGNOSIS — R32 Unspecified urinary incontinence: Secondary | ICD-10-CM | POA: Diagnosis not present

## 2015-08-05 DIAGNOSIS — D649 Anemia, unspecified: Secondary | ICD-10-CM | POA: Diagnosis not present

## 2015-09-17 DIAGNOSIS — M79674 Pain in right toe(s): Secondary | ICD-10-CM | POA: Diagnosis not present

## 2015-09-17 DIAGNOSIS — B351 Tinea unguium: Secondary | ICD-10-CM | POA: Diagnosis not present

## 2015-09-17 DIAGNOSIS — M79675 Pain in left toe(s): Secondary | ICD-10-CM | POA: Diagnosis not present

## 2015-12-02 DIAGNOSIS — R32 Unspecified urinary incontinence: Secondary | ICD-10-CM | POA: Diagnosis not present

## 2015-12-02 DIAGNOSIS — T7840XA Allergy, unspecified, initial encounter: Secondary | ICD-10-CM | POA: Diagnosis not present

## 2015-12-02 DIAGNOSIS — R799 Abnormal finding of blood chemistry, unspecified: Secondary | ICD-10-CM | POA: Diagnosis not present

## 2015-12-02 DIAGNOSIS — R8290 Unspecified abnormal findings in urine: Secondary | ICD-10-CM | POA: Diagnosis not present

## 2015-12-02 DIAGNOSIS — L259 Unspecified contact dermatitis, unspecified cause: Secondary | ICD-10-CM | POA: Diagnosis not present

## 2015-12-02 DIAGNOSIS — E784 Other hyperlipidemia: Secondary | ICD-10-CM | POA: Diagnosis not present

## 2015-12-02 DIAGNOSIS — D649 Anemia, unspecified: Secondary | ICD-10-CM | POA: Diagnosis not present

## 2015-12-02 DIAGNOSIS — Z23 Encounter for immunization: Secondary | ICD-10-CM | POA: Diagnosis not present

## 2015-12-02 DIAGNOSIS — Z0000 Encounter for general adult medical examination without abnormal findings: Secondary | ICD-10-CM | POA: Diagnosis not present

## 2015-12-02 DIAGNOSIS — F0391 Unspecified dementia with behavioral disturbance: Secondary | ICD-10-CM | POA: Diagnosis not present

## 2015-12-02 DIAGNOSIS — F0390 Unspecified dementia without behavioral disturbance: Secondary | ICD-10-CM | POA: Diagnosis not present

## 2015-12-10 DIAGNOSIS — H25812 Combined forms of age-related cataract, left eye: Secondary | ICD-10-CM | POA: Diagnosis not present

## 2016-01-01 DIAGNOSIS — M79675 Pain in left toe(s): Secondary | ICD-10-CM | POA: Diagnosis not present

## 2016-01-01 DIAGNOSIS — M79674 Pain in right toe(s): Secondary | ICD-10-CM | POA: Diagnosis not present

## 2016-01-01 DIAGNOSIS — B351 Tinea unguium: Secondary | ICD-10-CM | POA: Diagnosis not present

## 2016-02-27 DIAGNOSIS — R32 Unspecified urinary incontinence: Secondary | ICD-10-CM | POA: Diagnosis not present

## 2016-02-27 DIAGNOSIS — J069 Acute upper respiratory infection, unspecified: Secondary | ICD-10-CM | POA: Diagnosis not present

## 2016-02-27 DIAGNOSIS — F0390 Unspecified dementia without behavioral disturbance: Secondary | ICD-10-CM | POA: Diagnosis not present

## 2016-04-19 DIAGNOSIS — M79675 Pain in left toe(s): Secondary | ICD-10-CM | POA: Diagnosis not present

## 2016-04-19 DIAGNOSIS — B351 Tinea unguium: Secondary | ICD-10-CM | POA: Diagnosis not present

## 2016-05-27 DIAGNOSIS — F0390 Unspecified dementia without behavioral disturbance: Secondary | ICD-10-CM | POA: Diagnosis not present

## 2016-05-27 DIAGNOSIS — D649 Anemia, unspecified: Secondary | ICD-10-CM | POA: Diagnosis not present

## 2016-05-27 DIAGNOSIS — R32 Unspecified urinary incontinence: Secondary | ICD-10-CM | POA: Diagnosis not present

## 2016-06-25 DIAGNOSIS — H25812 Combined forms of age-related cataract, left eye: Secondary | ICD-10-CM | POA: Diagnosis not present

## 2016-06-28 DIAGNOSIS — M79674 Pain in right toe(s): Secondary | ICD-10-CM | POA: Diagnosis not present

## 2016-06-28 DIAGNOSIS — M79675 Pain in left toe(s): Secondary | ICD-10-CM | POA: Diagnosis not present

## 2016-06-28 DIAGNOSIS — B351 Tinea unguium: Secondary | ICD-10-CM | POA: Diagnosis not present

## 2016-08-30 DIAGNOSIS — M79675 Pain in left toe(s): Secondary | ICD-10-CM | POA: Diagnosis not present

## 2016-08-30 DIAGNOSIS — B351 Tinea unguium: Secondary | ICD-10-CM | POA: Diagnosis not present

## 2016-08-30 DIAGNOSIS — M79674 Pain in right toe(s): Secondary | ICD-10-CM | POA: Diagnosis not present

## 2016-08-31 DIAGNOSIS — L219 Seborrheic dermatitis, unspecified: Secondary | ICD-10-CM | POA: Diagnosis not present

## 2016-08-31 DIAGNOSIS — F0390 Unspecified dementia without behavioral disturbance: Secondary | ICD-10-CM | POA: Diagnosis not present

## 2016-10-06 DIAGNOSIS — F0391 Unspecified dementia with behavioral disturbance: Secondary | ICD-10-CM | POA: Diagnosis not present

## 2016-10-06 DIAGNOSIS — R32 Unspecified urinary incontinence: Secondary | ICD-10-CM | POA: Diagnosis not present

## 2016-11-15 DIAGNOSIS — M79674 Pain in right toe(s): Secondary | ICD-10-CM | POA: Diagnosis not present

## 2016-11-15 DIAGNOSIS — M79675 Pain in left toe(s): Secondary | ICD-10-CM | POA: Diagnosis not present

## 2016-11-15 DIAGNOSIS — B351 Tinea unguium: Secondary | ICD-10-CM | POA: Diagnosis not present

## 2016-11-26 DIAGNOSIS — Z23 Encounter for immunization: Secondary | ICD-10-CM | POA: Diagnosis not present

## 2016-12-09 DIAGNOSIS — E785 Hyperlipidemia, unspecified: Secondary | ICD-10-CM | POA: Diagnosis not present

## 2016-12-09 DIAGNOSIS — L259 Unspecified contact dermatitis, unspecified cause: Secondary | ICD-10-CM | POA: Diagnosis not present

## 2016-12-09 DIAGNOSIS — F0391 Unspecified dementia with behavioral disturbance: Secondary | ICD-10-CM | POA: Diagnosis not present

## 2016-12-09 DIAGNOSIS — Z1389 Encounter for screening for other disorder: Secondary | ICD-10-CM | POA: Diagnosis not present

## 2016-12-09 DIAGNOSIS — F0390 Unspecified dementia without behavioral disturbance: Secondary | ICD-10-CM | POA: Diagnosis not present

## 2016-12-09 DIAGNOSIS — T7840XA Allergy, unspecified, initial encounter: Secondary | ICD-10-CM | POA: Diagnosis not present

## 2016-12-09 DIAGNOSIS — Z0000 Encounter for general adult medical examination without abnormal findings: Secondary | ICD-10-CM | POA: Diagnosis not present

## 2016-12-09 DIAGNOSIS — R8290 Unspecified abnormal findings in urine: Secondary | ICD-10-CM | POA: Diagnosis not present

## 2016-12-09 DIAGNOSIS — R32 Unspecified urinary incontinence: Secondary | ICD-10-CM | POA: Diagnosis not present

## 2017-01-06 DIAGNOSIS — H25812 Combined forms of age-related cataract, left eye: Secondary | ICD-10-CM | POA: Diagnosis not present

## 2017-01-24 DIAGNOSIS — M79675 Pain in left toe(s): Secondary | ICD-10-CM | POA: Diagnosis not present

## 2017-01-24 DIAGNOSIS — M79674 Pain in right toe(s): Secondary | ICD-10-CM | POA: Diagnosis not present

## 2017-01-24 DIAGNOSIS — B351 Tinea unguium: Secondary | ICD-10-CM | POA: Diagnosis not present

## 2017-01-26 DIAGNOSIS — Z23 Encounter for immunization: Secondary | ICD-10-CM | POA: Diagnosis not present

## 2017-03-10 DIAGNOSIS — R32 Unspecified urinary incontinence: Secondary | ICD-10-CM | POA: Diagnosis not present

## 2017-03-10 DIAGNOSIS — L259 Unspecified contact dermatitis, unspecified cause: Secondary | ICD-10-CM | POA: Diagnosis not present

## 2017-03-10 DIAGNOSIS — D649 Anemia, unspecified: Secondary | ICD-10-CM | POA: Diagnosis not present

## 2017-03-10 DIAGNOSIS — F0390 Unspecified dementia without behavioral disturbance: Secondary | ICD-10-CM | POA: Diagnosis not present

## 2017-04-12 DIAGNOSIS — M79674 Pain in right toe(s): Secondary | ICD-10-CM | POA: Diagnosis not present

## 2017-04-12 DIAGNOSIS — M79675 Pain in left toe(s): Secondary | ICD-10-CM | POA: Diagnosis not present

## 2017-04-12 DIAGNOSIS — B351 Tinea unguium: Secondary | ICD-10-CM | POA: Diagnosis not present

## 2017-06-06 ENCOUNTER — Emergency Department (HOSPITAL_COMMUNITY)
Admission: EM | Admit: 2017-06-06 | Discharge: 2017-06-06 | Disposition: A | Discharge status: Home / Self Care | Payer: Medicare Other | Attending: Emergency Medicine | Admitting: Emergency Medicine

## 2017-06-06 ENCOUNTER — Encounter (HOSPITAL_COMMUNITY): Payer: Self-pay | Admitting: Emergency Medicine

## 2017-06-06 ENCOUNTER — Other Ambulatory Visit: Payer: Self-pay

## 2017-06-06 DIAGNOSIS — Z79899 Other long term (current) drug therapy: Secondary | ICD-10-CM | POA: Diagnosis not present

## 2017-06-06 DIAGNOSIS — W19XXXA Unspecified fall, initial encounter: Secondary | ICD-10-CM

## 2017-06-06 DIAGNOSIS — Z7401 Bed confinement status: Secondary | ICD-10-CM | POA: Diagnosis not present

## 2017-06-06 DIAGNOSIS — Z7982 Long term (current) use of aspirin: Secondary | ICD-10-CM | POA: Diagnosis not present

## 2017-06-06 DIAGNOSIS — Z043 Encounter for examination and observation following other accident: Secondary | ICD-10-CM | POA: Insufficient documentation

## 2017-06-06 DIAGNOSIS — F0390 Unspecified dementia without behavioral disturbance: Secondary | ICD-10-CM | POA: Insufficient documentation

## 2017-06-06 DIAGNOSIS — T1490XA Injury, unspecified, initial encounter: Secondary | ICD-10-CM | POA: Diagnosis not present

## 2017-06-06 DIAGNOSIS — R279 Unspecified lack of coordination: Secondary | ICD-10-CM | POA: Diagnosis not present

## 2017-06-06 NOTE — ED Triage Notes (Signed)
Pt got up and fell from a standing position.  Not complaining of anything.

## 2017-06-06 NOTE — ED Notes (Signed)
Pt provided with supper tray and attempted to get pt to eat.  Pt insists she will wait until she gets back to [facility].

## 2017-06-06 NOTE — ED Provider Notes (Signed)
[facility] Provider Note   CSN: [id number] Arrival date & time: 06/06/17  1539     History   Chief Complaint Chief Complaint  Patient presents with  . Fall    HPI Judy Stevenson is a 84 y.o. female.  HPI   34yF presenting after fall. Pt states she was leaving her room when she fell. She has dementia and doesn't give me much additional history beyond this. Happened earlier today. She denies any pain or other complaints. Not anticoagulated.   Past Medical History:  Diagnosis Date  . Dementia   . GERD (gastroesophageal reflux disease)     There are no active problems to display for this patient.   Past Surgical History:  Procedure Laterality Date  . CATARACT EXTRACTION W/PHACO Right 03/04/2015   Procedure: CATARACT EXTRACTION PHACO AND INTRAOCULAR LENS PLACEMENT (IOC);  Surgeon: Rutherford Guys, MD;  Location: [facility];  Service: Ophthalmology;  Laterality: Right;  CDE:45.80     OB History   None      Home Medications    Prior to Admission medications   Medication Sig Start Date End Date Taking? Authorizing Provider  acetaminophen 325 MG tablet Take 2 tablets (650 mg total) by mouth every 6 (six) hours as needed for mild pain or moderate pain. 12/15/14   Ward, Delice Bison, DO  antiseptic oral rinse (BIOTENE) LIQD 15 mLs by Mouth Rinse route as needed for dry mouth.    [provider]  aspirin EC 81 MG tablet Take 81 mg by mouth daily.    [provider]  calcium-vitamin D (OSCAL WITH D) 500-200 MG-UNIT tablet Take 1 tablet by mouth 3 (three) times daily.    [provider]  clobetasol cream (TEMOVATE) 3.50 % Apply 1 application topically 2 (two) times daily.    [provider]  docusate sodium (COLACE) 100 MG capsule Take 100 mg by mouth daily.    [provider]  ferrous sulfate 325 (65 FE) MG tablet Take 325 mg by mouth 2 (two) times daily with a meal.    [provider]  fluticasone (CUTIVATE)  0.05 % cream Apply 1 application topically daily as needed (rash).    [provider]  ketoconazole (NIZORAL) 2 % cream Apply 1 application topically daily as needed for irritation.    [provider]  Multiple Vitamin (DAILY VITE) TABS Take 1 tablet by mouth 2 (two) times daily.    [provider]  ofloxacin (FLOXIN) 0.3 % otic solution Apply 1 drop to eye 2 (two) times daily.    [provider]  oxybutynin (DITROPAN-XL) 10 MG 24 hr tablet Take 10 mg by mouth daily.    [provider]  pantoprazole (PROTONIX) 40 MG tablet Take 40 mg by mouth daily.    [provider]  prednisoLONE acetate (PRED FORTE) 1 % ophthalmic suspension Apply 1 drop to eye 4 (four) times daily.    [provider]    Family History History reviewed. No pertinent family history.  Social History Social History   Tobacco Use  . Smoking status: Never Smoker  Substance Use Topics  . Alcohol use: No  . Drug use: No     Allergies   Patient has no known allergies.   Review of Systems Review of Systems Level 5 caveat because of dementia.  Physical Exam Updated Vital Signs BP (!) 148/76 (BP Location: Left Arm)   Pulse 88   Temp 98.8 F (37.1 C) (Oral)  Resp 15   Ht 5\' 4"  (1.626 m)   Wt 54.9 kg (121 lb)   SpO2 96%   BMI 20.77 kg/m   Physical Exam  Constitutional: She appears well-developed and well-nourished. No distress.  HENT:  Head: Normocephalic and atraumatic.  Eyes: Conjunctivae are normal. Right eye exhibits no discharge. Left eye exhibits no discharge.  Neck: Neck supple.  Cardiovascular: Normal rate, regular rhythm and normal heart sounds. Exam reveals no gallop and no friction rub.  No murmur heard. Pulmonary/Chest: Effort normal and breath sounds normal. No respiratory distress.  Abdominal: Soft. She exhibits no distension. There is no tenderness.  Musculoskeletal: She exhibits no edema or tenderness.  No midline spinal  tenderness. No tenderness with palpation of extremities or apparent pain with ROM or large joints. She was able to sit up in bed and stand beside stretcher without assistance. She ambulated a few steps with me holding her hand w/o any apparent difficulty.   Neurological: She is alert. No cranial nerve deficit. She exhibits normal muscle tone. Coordination normal.  Skin: Skin is warm and dry.  Psychiatric: She has a normal mood and affect. Her behavior is normal. Thought content normal.  Nursing note and vitals reviewed.    ED Treatments / Results  Labs (all labs ordered are listed, but only abnormal results are displayed) Labs Reviewed - No data to display  EKG None  Radiology No results found.  Procedures Procedures (including critical care time)  Medications Ordered in ED Medications - No data to display   Initial Impression / Assessment and Plan / ED Course  I have reviewed the triage vital signs and the nursing notes.  Pertinent labs & imaging results that were available during my care of the patient were reviewed by me and considered in my medical decision making (see chart for details).  89yF presenting after fall. She has no acute complaints. Doesn't have any external signs of acute trauma. Aside from some confusion, her exam is very reassuring.  Afebrile. HD stable.  It has been determined that no acute conditions requiring further emergency intervention are present at this time.  We have discussed signs and symptoms that warrant return to the ED and they are listed in the discharge instructions for the staff at her facility to review. .    Final Clinical Impressions(s) / ED Diagnoses   Final diagnoses:  Fall, initial encounter    ED Discharge Orders    None       Virgel Manifold, MD 06/06/17 1857

## 2017-06-06 NOTE — ED Notes (Signed)
EMS called for transport home.

## 2017-06-08 DIAGNOSIS — F0390 Unspecified dementia without behavioral disturbance: Secondary | ICD-10-CM | POA: Diagnosis not present

## 2017-06-08 DIAGNOSIS — R2681 Unsteadiness on feet: Secondary | ICD-10-CM | POA: Diagnosis not present

## 2017-06-08 DIAGNOSIS — W19XXXD Unspecified fall, subsequent encounter: Secondary | ICD-10-CM | POA: Diagnosis not present

## 2017-06-09 DIAGNOSIS — R296 Repeated falls: Secondary | ICD-10-CM | POA: Diagnosis not present

## 2017-06-09 DIAGNOSIS — R260 Ataxic gait: Secondary | ICD-10-CM | POA: Diagnosis not present

## 2017-06-09 DIAGNOSIS — F0390 Unspecified dementia without behavioral disturbance: Secondary | ICD-10-CM | POA: Diagnosis not present

## 2017-06-14 DIAGNOSIS — M79674 Pain in right toe(s): Secondary | ICD-10-CM | POA: Diagnosis not present

## 2017-06-14 DIAGNOSIS — M79675 Pain in left toe(s): Secondary | ICD-10-CM | POA: Diagnosis not present

## 2017-06-14 DIAGNOSIS — B351 Tinea unguium: Secondary | ICD-10-CM | POA: Diagnosis not present

## 2017-06-14 DIAGNOSIS — F0390 Unspecified dementia without behavioral disturbance: Secondary | ICD-10-CM | POA: Diagnosis not present

## 2017-06-14 DIAGNOSIS — R296 Repeated falls: Secondary | ICD-10-CM | POA: Diagnosis not present

## 2017-06-14 DIAGNOSIS — R260 Ataxic gait: Secondary | ICD-10-CM | POA: Diagnosis not present

## 2017-06-16 DIAGNOSIS — F0390 Unspecified dementia without behavioral disturbance: Secondary | ICD-10-CM | POA: Diagnosis not present

## 2017-06-16 DIAGNOSIS — R296 Repeated falls: Secondary | ICD-10-CM | POA: Diagnosis not present

## 2017-06-16 DIAGNOSIS — R260 Ataxic gait: Secondary | ICD-10-CM | POA: Diagnosis not present

## 2017-06-21 DIAGNOSIS — F0390 Unspecified dementia without behavioral disturbance: Secondary | ICD-10-CM | POA: Diagnosis not present

## 2017-06-21 DIAGNOSIS — R296 Repeated falls: Secondary | ICD-10-CM | POA: Diagnosis not present

## 2017-06-21 DIAGNOSIS — R260 Ataxic gait: Secondary | ICD-10-CM | POA: Diagnosis not present

## 2017-06-23 DIAGNOSIS — R260 Ataxic gait: Secondary | ICD-10-CM | POA: Diagnosis not present

## 2017-06-23 DIAGNOSIS — F0390 Unspecified dementia without behavioral disturbance: Secondary | ICD-10-CM | POA: Diagnosis not present

## 2017-06-23 DIAGNOSIS — R296 Repeated falls: Secondary | ICD-10-CM | POA: Diagnosis not present

## 2017-06-27 DIAGNOSIS — R296 Repeated falls: Secondary | ICD-10-CM | POA: Diagnosis not present

## 2017-06-27 DIAGNOSIS — R260 Ataxic gait: Secondary | ICD-10-CM | POA: Diagnosis not present

## 2017-06-27 DIAGNOSIS — F0390 Unspecified dementia without behavioral disturbance: Secondary | ICD-10-CM | POA: Diagnosis not present

## 2017-07-01 DIAGNOSIS — F0390 Unspecified dementia without behavioral disturbance: Secondary | ICD-10-CM | POA: Diagnosis not present

## 2017-07-01 DIAGNOSIS — R296 Repeated falls: Secondary | ICD-10-CM | POA: Diagnosis not present

## 2017-07-01 DIAGNOSIS — R260 Ataxic gait: Secondary | ICD-10-CM | POA: Diagnosis not present

## 2017-07-06 DIAGNOSIS — R296 Repeated falls: Secondary | ICD-10-CM | POA: Diagnosis not present

## 2017-07-06 DIAGNOSIS — F0390 Unspecified dementia without behavioral disturbance: Secondary | ICD-10-CM | POA: Diagnosis not present

## 2017-07-06 DIAGNOSIS — R260 Ataxic gait: Secondary | ICD-10-CM | POA: Diagnosis not present

## 2017-07-07 DIAGNOSIS — F0390 Unspecified dementia without behavioral disturbance: Secondary | ICD-10-CM | POA: Diagnosis not present

## 2017-07-07 DIAGNOSIS — R260 Ataxic gait: Secondary | ICD-10-CM | POA: Diagnosis not present

## 2017-07-07 DIAGNOSIS — R296 Repeated falls: Secondary | ICD-10-CM | POA: Diagnosis not present

## 2017-07-12 DIAGNOSIS — R296 Repeated falls: Secondary | ICD-10-CM | POA: Diagnosis not present

## 2017-07-12 DIAGNOSIS — F0390 Unspecified dementia without behavioral disturbance: Secondary | ICD-10-CM | POA: Diagnosis not present

## 2017-07-12 DIAGNOSIS — R260 Ataxic gait: Secondary | ICD-10-CM | POA: Diagnosis not present

## 2017-07-13 DIAGNOSIS — R296 Repeated falls: Secondary | ICD-10-CM | POA: Diagnosis not present

## 2017-07-13 DIAGNOSIS — F0390 Unspecified dementia without behavioral disturbance: Secondary | ICD-10-CM | POA: Diagnosis not present

## 2017-07-13 DIAGNOSIS — R260 Ataxic gait: Secondary | ICD-10-CM | POA: Diagnosis not present

## 2017-07-14 DIAGNOSIS — H524 Presbyopia: Secondary | ICD-10-CM | POA: Diagnosis not present

## 2017-07-14 DIAGNOSIS — H25812 Combined forms of age-related cataract, left eye: Secondary | ICD-10-CM | POA: Diagnosis not present

## 2017-08-09 DIAGNOSIS — H524 Presbyopia: Secondary | ICD-10-CM | POA: Diagnosis not present

## 2017-08-16 DIAGNOSIS — B351 Tinea unguium: Secondary | ICD-10-CM | POA: Diagnosis not present

## 2017-08-16 DIAGNOSIS — M79674 Pain in right toe(s): Secondary | ICD-10-CM | POA: Diagnosis not present

## 2017-08-16 DIAGNOSIS — M79675 Pain in left toe(s): Secondary | ICD-10-CM | POA: Diagnosis not present

## 2017-09-08 DIAGNOSIS — F0390 Unspecified dementia without behavioral disturbance: Secondary | ICD-10-CM | POA: Diagnosis not present

## 2017-09-08 DIAGNOSIS — R32 Unspecified urinary incontinence: Secondary | ICD-10-CM | POA: Diagnosis not present

## 2017-10-18 DIAGNOSIS — B351 Tinea unguium: Secondary | ICD-10-CM | POA: Diagnosis not present

## 2017-10-18 DIAGNOSIS — M79674 Pain in right toe(s): Secondary | ICD-10-CM | POA: Diagnosis not present

## 2017-10-18 DIAGNOSIS — M79675 Pain in left toe(s): Secondary | ICD-10-CM | POA: Diagnosis not present

## 2017-11-07 ENCOUNTER — Emergency Department (HOSPITAL_COMMUNITY): Payer: Medicare Other

## 2017-11-07 ENCOUNTER — Other Ambulatory Visit: Payer: Self-pay

## 2017-11-07 ENCOUNTER — Emergency Department (HOSPITAL_COMMUNITY)
Admission: EM | Admit: 2017-11-07 | Discharge: 2017-11-07 | Disposition: A | Discharge status: Home / Self Care | Payer: Medicare Other | Attending: Emergency Medicine | Admitting: Emergency Medicine

## 2017-11-07 ENCOUNTER — Encounter (HOSPITAL_COMMUNITY): Payer: Self-pay

## 2017-11-07 DIAGNOSIS — M542 Cervicalgia: Secondary | ICD-10-CM | POA: Diagnosis not present

## 2017-11-07 DIAGNOSIS — S0993XA Unspecified injury of face, initial encounter: Secondary | ICD-10-CM

## 2017-11-07 DIAGNOSIS — S199XXA Unspecified injury of neck, initial encounter: Secondary | ICD-10-CM | POA: Insufficient documentation

## 2017-11-07 DIAGNOSIS — W010XXA Fall on same level from slipping, tripping and stumbling without subsequent striking against object, initial encounter: Secondary | ICD-10-CM | POA: Insufficient documentation

## 2017-11-07 DIAGNOSIS — N3000 Acute cystitis without hematuria: Secondary | ICD-10-CM | POA: Diagnosis not present

## 2017-11-07 DIAGNOSIS — Y92129 Unspecified place in nursing home as the place of occurrence of the external cause: Secondary | ICD-10-CM | POA: Insufficient documentation

## 2017-11-07 DIAGNOSIS — R51 Headache: Secondary | ICD-10-CM | POA: Diagnosis not present

## 2017-11-07 DIAGNOSIS — M25562 Pain in left knee: Secondary | ICD-10-CM

## 2017-11-07 DIAGNOSIS — W19XXXA Unspecified fall, initial encounter: Secondary | ICD-10-CM

## 2017-11-07 DIAGNOSIS — M25552 Pain in left hip: Secondary | ICD-10-CM | POA: Diagnosis not present

## 2017-11-07 DIAGNOSIS — S79912A Unspecified injury of left hip, initial encounter: Secondary | ICD-10-CM | POA: Diagnosis not present

## 2017-11-07 DIAGNOSIS — Y999 Unspecified external cause status: Secondary | ICD-10-CM | POA: Insufficient documentation

## 2017-11-07 DIAGNOSIS — Y939 Activity, unspecified: Secondary | ICD-10-CM | POA: Insufficient documentation

## 2017-11-07 DIAGNOSIS — S0990XA Unspecified injury of head, initial encounter: Secondary | ICD-10-CM | POA: Insufficient documentation

## 2017-11-07 DIAGNOSIS — F0390 Unspecified dementia without behavioral disturbance: Secondary | ICD-10-CM | POA: Insufficient documentation

## 2017-11-07 DIAGNOSIS — S299XXA Unspecified injury of thorax, initial encounter: Secondary | ICD-10-CM | POA: Diagnosis not present

## 2017-11-07 DIAGNOSIS — S8992XA Unspecified injury of left lower leg, initial encounter: Secondary | ICD-10-CM | POA: Diagnosis not present

## 2017-11-07 LAB — URINALYSIS, ROUTINE W REFLEX MICROSCOPIC
BILIRUBIN URINE: NEGATIVE
Glucose, UA: NEGATIVE mg/dL
Ketones, ur: NEGATIVE mg/dL
Leukocytes, UA: NEGATIVE
Nitrite: POSITIVE — AB
PROTEIN: NEGATIVE mg/dL
Specific Gravity, Urine: 1.009 (ref 1.005–1.030)
pH: 7 (ref 5.0–8.0)

## 2017-11-07 LAB — I-STAT CG4 LACTIC ACID, ED: LACTIC ACID, VENOUS: 1.54 mmol/L (ref 0.5–1.9)

## 2017-11-07 LAB — CBC WITH DIFFERENTIAL/PLATELET
Basophils Absolute: 0 10*3/uL (ref 0.0–0.1)
Basophils Relative: 0 %
Eosinophils Absolute: 0.2 10*3/uL (ref 0.0–0.7)
Eosinophils Relative: 1 %
HCT: 39.9 % (ref 36.0–46.0)
HEMOGLOBIN: 13.3 g/dL (ref 12.0–15.0)
LYMPHS ABS: 1.9 10*3/uL (ref 0.7–4.0)
LYMPHS PCT: 16 %
MCH: 31.7 pg (ref 26.0–34.0)
MCHC: 33.3 g/dL (ref 30.0–36.0)
MCV: 95 fL (ref 78.0–100.0)
Monocytes Absolute: 1.3 10*3/uL — ABNORMAL HIGH (ref 0.1–1.0)
Monocytes Relative: 11 %
NEUTROS PCT: 72 %
Neutro Abs: 8.7 10*3/uL — ABNORMAL HIGH (ref 1.7–7.7)
Platelets: 302 10*3/uL (ref 150–400)
RBC: 4.2 MIL/uL (ref 3.87–5.11)
RDW: 13.4 % (ref 11.5–15.5)
WBC: 12.1 10*3/uL — AB (ref 4.0–10.5)

## 2017-11-07 LAB — COMPREHENSIVE METABOLIC PANEL
ALT: 19 U/L (ref 0–44)
AST: 24 U/L (ref 15–41)
Albumin: 4.1 g/dL (ref 3.5–5.0)
Alkaline Phosphatase: 72 U/L (ref 38–126)
Anion gap: 7 (ref 5–15)
BUN: 18 mg/dL (ref 8–23)
CHLORIDE: 102 mmol/L (ref 98–111)
CO2: 27 mmol/L (ref 22–32)
Calcium: 9.1 mg/dL (ref 8.9–10.3)
Creatinine, Ser: 0.84 mg/dL (ref 0.44–1.00)
GFR, EST NON AFRICAN AMERICAN: 57 mL/min — AB (ref >60)
Glucose, Bld: 122 mg/dL — ABNORMAL HIGH (ref 70–99)
POTASSIUM: 4 mmol/L (ref 3.5–5.1)
Sodium: 136 mmol/L (ref 135–145)
Total Bilirubin: 0.7 mg/dL (ref 0.3–1.2)
Total Protein: 7.1 g/dL (ref 6.5–8.1)

## 2017-11-07 MED ORDER — SODIUM CHLORIDE 0.9 % IV BOLUS
500.0000 mL | Freq: Once | INTRAVENOUS | Status: AC
  Administered 2017-11-07: 500 mL via INTRAVENOUS

## 2017-11-07 MED ORDER — SODIUM CHLORIDE 0.9 % IV SOLN
1.0000 g | Freq: Once | INTRAVENOUS | Status: AC
  Administered 2017-11-07: 1 g via INTRAVENOUS
  Filled 2017-11-07: qty 10

## 2017-11-07 MED ORDER — CEPHALEXIN 500 MG PO CAPS: 500.0000 mg | ORAL_CAPSULE | Freq: Four times a day (QID) | ORAL | 0 refills | Status: AC

## 2017-11-07 MED ORDER — ACETAMINOPHEN 500 MG PO TABS
1000.0000 mg | ORAL_TABLET | Freq: Once | ORAL | Status: AC
  Administered 2017-11-07: 1000 mg via ORAL
  Filled 2017-11-07: qty 2

## 2017-11-07 NOTE — ED Provider Notes (Signed)
Emergency Department Provider Note   I have reviewed the triage vital signs and the nursing notes.   HISTORY  Chief Complaint Fall   HPI Judy Stevenson is a 84 y.o. female with PMH of dementia and GERD's to the emergency department for evaluation of fall while at her nursing home.  The patient fell this morning while walking out of the dining room.  Patient cannot recall the fall or events surrounding this.  According to staff from the nursing home there also concern for possible UTI and have noticed some bruising adjacent to the right eye. Patient is complaining of right knee pain saying "It doesn't hurt unless I try to stand on it." Denies any abdominal or chest pain. Denies pain in other extremities but level 5 caveat applies with dementia history.   Past Medical History:  Diagnosis Date  . Dementia   . GERD (gastroesophageal reflux disease)     There are no active problems to display for this patient.   Past Surgical History:  Procedure Laterality Date  . CATARACT EXTRACTION W/PHACO Right 03/04/2015   Procedure: CATARACT EXTRACTION PHACO AND INTRAOCULAR LENS PLACEMENT (IOC);  Surgeon: Rutherford Guys, MD;  Location: [facility];  Service: Ophthalmology;  Laterality: Right;  CDE:45.80    Allergies Patient has no known allergies.  No family history on file.  Social History Social History   Tobacco Use  . Smoking status: Never Smoker  Substance Use Topics  . Alcohol use: No  . Drug use: No    Review of Systems  Constitutional: No fever/chills Eyes: No visual changes. ENT: No sore throat. Cardiovascular: Denies chest pain. Respiratory: Denies shortness of breath. Gastrointestinal: No abdominal pain.  No nausea, no vomiting.  No diarrhea.  No constipation. Genitourinary: Negative for dysuria. Musculoskeletal: Negative for back pain. Positive left knee pain.  Skin: Negative for rash. Neurological: Negative for headaches, focal weakness or numbness.  10-point ROS  otherwise negative.  ____________________________________________   PHYSICAL EXAM:  VITAL SIGNS: ED Triage Vitals  Enc Vitals Group     BP 11/07/17 1759 (!) 156/102     Pulse Rate 11/07/17 1759 (!) 115     Resp 11/07/17 1759 12     Temp 11/07/17 1759 100.3 F (37.9 C)     Temp Source 11/07/17 1759 Oral     SpO2 11/07/17 1759 93 %     Weight 11/07/17 1800 121 lb 0.5 oz (54.9 kg)   Constitutional: Alert but confused. Well appearing and in no acute distress. Eyes: Conjunctivae are normal. PERRL. Head: Atraumatic. Nose: No congestion/rhinnorhea. Mouth/Throat: Mucous membranes are moist. Neck: No stridor. No cervical spine tenderness to palpation. Cardiovascular: Tachycardia. Good peripheral circulation. Grossly normal heart sounds.   Respiratory: Normal respiratory effort.  No retractions. Lungs CTAB. Gastrointestinal: Soft and nontender. No distention.  Musculoskeletal: Positive left knee effusion without erythema or warmth. Normal ROM of the right and left knees, hips, and ankles. No upper extremity bruising or tenderness. Normal ROM.  Neurologic:  Normal speech and language. No gross focal neurologic deficits are appreciated.  Skin:  Skin is warm, dry and intact. No rash noted. Bruising to the right lateral eye.   ____________________________________________   LABS (all labs ordered are listed, but only abnormal results are displayed)  Labs Reviewed  COMPREHENSIVE METABOLIC PANEL - Abnormal; Notable for the following components:      Result Value   Glucose, Bld 122 (*)    GFR calc non Af Amer 57 (*)    All  other components within normal limits  CBC WITH DIFFERENTIAL/PLATELET - Abnormal; Notable for the following components:   WBC 12.1 (*)    Neutro Abs 8.7 (*)    Monocytes Absolute 1.3 (*)    All other components within normal limits  URINALYSIS, ROUTINE W REFLEX MICROSCOPIC - Abnormal; Notable for the following components:   APPearance HAZY (*)    Hgb urine dipstick  MODERATE (*)    Nitrite POSITIVE (*)    Bacteria, UA RARE (*)    All other components within normal limits  CULTURE, BLOOD (ROUTINE X 2)  CULTURE, BLOOD (ROUTINE X 2)  URINE CULTURE  I-STAT CG4 LACTIC ACID, ED   ____________________________________________  RADIOLOGY  Dg Chest 1 View  Result Date: 11/07/2017 CLINICAL DATA:  84 year old female status post fall this morning. Left knee pain. EXAM: CHEST  1 VIEW COMPARISON:  None. FINDINGS: [facility] upright view at 1908 hours. Lung volumes and mediastinal contours are within normal limits. Allowing for portable technique the lungs are clear. No acute osseous abnormality identified. IMPRESSION: No acute cardiopulmonary abnormality. Electronically Signed   By: Genevie Ann M.D.   On: 11/07/2017 19:55   Dg Knee 2 Views Left  Result Date: 11/07/2017 CLINICAL DATA:  Fall.  Pain. EXAM: LEFT KNEE - 1-2 VIEW COMPARISON:  None. FINDINGS: No evidence of tibial or femoral fracture, or dislocation. There is a suprapatellar/quadriceps tendon spur, and is possible that there is a nondisplaced fracture at the junction of the spur with the native patella as seen on the lateral view, see arrow. There may be a small suprapatellar bursa effusion. IMPRESSION: Cannot exclude a nondisplaced fracture of a suprapatellar spur. Suspected small effusion. No tibial or femoral fracture or dislocation. Electronically Signed   By: Staci Righter M.D.   On: 11/07/2017 19:58   Ct Head Wo Contrast  Result Date: 11/07/2017 CLINICAL DATA:  [place].  Pain. EXAM: CT HEAD WITHOUT CONTRAST CT MAXILLOFACIAL WITHOUT CONTRAST CT CERVICAL SPINE WITHOUT CONTRAST TECHNIQUE: Multidetector CT imaging of the head, cervical spine, and maxillofacial structures were performed using the standard protocol without intravenous contrast. Multiplanar CT image reconstructions of the cervical spine and maxillofacial structures were also generated. COMPARISON:  12/15/2014. FINDINGS: CT HEAD FINDINGS Brain: No evidence for  acute infarction, hemorrhage, mass lesion, hydrocephalus, or extra-axial fluid. Cerebral and cerebellar atrophy, not unexpected for age. Hypoattenuation of white matter, likely small vessel disease. Vascular: Calcification of the cavernous internal carotid arteries consistent with cerebrovascular atherosclerotic disease. No signs of intracranial large vessel occlusion. Skull: No skull fracture.  No significant scalp hematoma. Other: None. CT MAXILLOFACIAL FINDINGS Osseous: No fracture or mandibular dislocation. No destructive process. TMJ arthritis is noted. Orbits: Negative. No traumatic or inflammatory finding. RIGHT cataract extraction has been performed. Dense lenticular opacity on the LEFT. Sinuses and mastoids: Paranasal sinuses are clear. No blowout injury is evident. Changes of chronic mastoiditis bilaterally with reduced number of air cells, soft tissue or fluid within areas of coalescence, and BILATERAL middle ear fluid. Soft tissues: Unremarkable. CT CERVICAL SPINE FINDINGS Alignment: 2 mm facet mediated anterolisthesis C7-T1 otherwise anatomic. Skull base and vertebrae: No acute fracture. No primary bone lesion or focal pathologic process. Soft tissues and spinal canal: No prevertebral fluid or swelling. No visible canal hematoma. Disc levels: Disc space narrowing is most pronounced at C5-6 and C6-7. There does not appear to be significant canal stenosis. Upper chest: Dense fibrotic apical pleural thickening. Other: None.  Compared to prior exams, similar appearance. IMPRESSION: No skull fracture or intracranial  hemorrhage. No cervical spine fracture or traumatic subluxation. No facial fracture or significant sinus/soft tissue opacity. Electronically Signed   By: Staci Righter M.D.   On: 11/07/2017 20:07   Ct Cervical Spine Wo Contrast  Result Date: 11/07/2017 CLINICAL DATA:  [place].  Pain. EXAM: CT HEAD WITHOUT CONTRAST CT MAXILLOFACIAL WITHOUT CONTRAST CT CERVICAL SPINE WITHOUT CONTRAST TECHNIQUE:  Multidetector CT imaging of the head, cervical spine, and maxillofacial structures were performed using the standard protocol without intravenous contrast. Multiplanar CT image reconstructions of the cervical spine and maxillofacial structures were also generated. COMPARISON:  12/15/2014. FINDINGS: CT HEAD FINDINGS Brain: No evidence for acute infarction, hemorrhage, mass lesion, hydrocephalus, or extra-axial fluid. Cerebral and cerebellar atrophy, not unexpected for age. Hypoattenuation of white matter, likely small vessel disease. Vascular: Calcification of the cavernous internal carotid arteries consistent with cerebrovascular atherosclerotic disease. No signs of intracranial large vessel occlusion. Skull: No skull fracture.  No significant scalp hematoma. Other: None. CT MAXILLOFACIAL FINDINGS Osseous: No fracture or mandibular dislocation. No destructive process. TMJ arthritis is noted. Orbits: Negative. No traumatic or inflammatory finding. RIGHT cataract extraction has been performed. Dense lenticular opacity on the LEFT. Sinuses and mastoids: Paranasal sinuses are clear. No blowout injury is evident. Changes of chronic mastoiditis bilaterally with reduced number of air cells, soft tissue or fluid within areas of coalescence, and BILATERAL middle ear fluid. Soft tissues: Unremarkable. CT CERVICAL SPINE FINDINGS Alignment: 2 mm facet mediated anterolisthesis C7-T1 otherwise anatomic. Skull base and vertebrae: No acute fracture. No primary bone lesion or focal pathologic process. Soft tissues and spinal canal: No prevertebral fluid or swelling. No visible canal hematoma. Disc levels: Disc space narrowing is most pronounced at C5-6 and C6-7. There does not appear to be significant canal stenosis. Upper chest: Dense fibrotic apical pleural thickening. Other: None.  Compared to prior exams, similar appearance. IMPRESSION: No skull fracture or intracranial hemorrhage. No cervical spine fracture or traumatic  subluxation. No facial fracture or significant sinus/soft tissue opacity. Electronically Signed   By: Staci Righter M.D.   On: 11/07/2017 20:07   Dg Hip Unilat W Or Wo Pelvis 2-3 Views Left  Result Date: 11/07/2017 CLINICAL DATA:  Fall.  Pain. EXAM: DG HIP (WITH OR WITHOUT PELVIS) 2-3V LEFT COMPARISON:  None. FINDINGS: There is no evidence of hip fracture or dislocation. There is no evidence of arthropathy or other focal bone abnormality. IMPRESSION: Negative. Electronically Signed   By: Staci Righter M.D.   On: 11/07/2017 19:56   Ct Maxillofacial Wo Contrast  Result Date: 11/07/2017 CLINICAL DATA:  [place].  Pain. EXAM: CT HEAD WITHOUT CONTRAST CT MAXILLOFACIAL WITHOUT CONTRAST CT CERVICAL SPINE WITHOUT CONTRAST TECHNIQUE: Multidetector CT imaging of the head, cervical spine, and maxillofacial structures were performed using the standard protocol without intravenous contrast. Multiplanar CT image reconstructions of the cervical spine and maxillofacial structures were also generated. COMPARISON:  12/15/2014. FINDINGS: CT HEAD FINDINGS Brain: No evidence for acute infarction, hemorrhage, mass lesion, hydrocephalus, or extra-axial fluid. Cerebral and cerebellar atrophy, not unexpected for age. Hypoattenuation of white matter, likely small vessel disease. Vascular: Calcification of the cavernous internal carotid arteries consistent with cerebrovascular atherosclerotic disease. No signs of intracranial large vessel occlusion. Skull: No skull fracture.  No significant scalp hematoma. Other: None. CT MAXILLOFACIAL FINDINGS Osseous: No fracture or mandibular dislocation. No destructive process. TMJ arthritis is noted. Orbits: Negative. No traumatic or inflammatory finding. RIGHT cataract extraction has been performed. Dense lenticular opacity on the LEFT. Sinuses and mastoids: Paranasal sinuses are clear. No  blowout injury is evident. Changes of chronic mastoiditis bilaterally with reduced number of air cells, soft  tissue or fluid within areas of coalescence, and BILATERAL middle ear fluid. Soft tissues: Unremarkable. CT CERVICAL SPINE FINDINGS Alignment: 2 mm facet mediated anterolisthesis C7-T1 otherwise anatomic. Skull base and vertebrae: No acute fracture. No primary bone lesion or focal pathologic process. Soft tissues and spinal canal: No prevertebral fluid or swelling. No visible canal hematoma. Disc levels: Disc space narrowing is most pronounced at C5-6 and C6-7. There does not appear to be significant canal stenosis. Upper chest: Dense fibrotic apical pleural thickening. Other: None.  Compared to prior exams, similar appearance. IMPRESSION: No skull fracture or intracranial hemorrhage. No cervical spine fracture or traumatic subluxation. No facial fracture or significant sinus/soft tissue opacity. Electronically Signed   By: Staci Righter M.D.   On: 11/07/2017 20:07    ____________________________________________   PROCEDURES  Procedure(s) performed:   Procedures  None ____________________________________________   INITIAL IMPRESSION / ASSESSMENT AND PLAN / ED COURSE  Pertinent labs & imaging results that were available during my care of the patient were reviewed by me and considered in my medical decision making (see chart for details).  Patient presents to the emergency department for evaluation after fall.  She is found to be nearly febrile here with tachycardia.  Story is limited by patient's underlying dementia.  Plan for trauma evaluation with CT imaging of the head, face, cervical spine.  Will also obtain plain films of the left hip and left knee.  No clear source of infection at this time.  Plan for UA, cultures, labs, chest x-ray, and reassess. Doubt sepsis clinically.   UA with evidence of UTI. Remaining labs reviewed with no acute findings. Patient plain films and CT imaging reviewed. Patient has a non-displaced fracture through a patellar spur. No fracture of the patellar body. No  long-bone fracture. Patient WBAT at nursing facility. May require Tylenol for pain and PT/OT services. Patient given Rocephin for UTI and to be discharged home with Keflex. Patient is able to ambulate several steps. She is returning to a facility where assistance and further treatment can be provided so patient is safe for discharge.   ____________________________________________  FINAL CLINICAL IMPRESSION(S) / ED DIAGNOSES  Final diagnoses:  Fall, initial encounter  Head, face & neck injury, initial encounter  Acute cystitis without hematuria  Acute pain of left knee     MEDICATIONS GIVEN DURING THIS VISIT:  Medications  acetaminophen (TYLENOL) tablet 1,000 mg (1,000 mg Oral Given 11/07/17 1907)  sodium chloride 0.9 % bolus 500 mL (0 mLs Intravenous Stopped 11/07/17 2021)  cefTRIAXone (ROCEPHIN) 1 g in sodium chloride 0.9 % 100 mL IVPB (0 g Intravenous Stopped 11/07/17 2040)     NEW OUTPATIENT MEDICATIONS STARTED DURING THIS VISIT:  Discharge Medication List as of 11/07/2017  9:46 PM    START taking these medications   Details  cephALEXin (KEFLEX) 500 MG capsule Take 1 capsule (500 mg total) by mouth 4 (four) times daily for 7 days., Starting Mon 11/07/2017, Until Mon 11/14/2017, Print        Note:  This document was prepared using Dragon voice recognition software and may include unintentional dictation errors.  Nanda Quinton, MD Emergency Medicine    Long, Wonda [facility], MD 11/08/17 1026

## 2017-11-07 NOTE — Discharge Instructions (Signed)
You have been seen in the Emergency Department (ED) today for a fall.  Your work up does not show any concerning injuries.  Please take over-the-counter Tylenol as needed for your pain (unless you have an allergy or your doctor as told you not to take them), or take any prescribed medication as instructed.  We did find some evidence of a urine infection. I have given IV antibiotics and will prescribe 1 week of oral antibiotics.   Please follow up with your doctor regarding today's Emergency Department (ED) visit and your recent fall.    Return to the ED if you have any headache, confusion, slurred speech, weakness/numbness of any arm or leg, or any increased pain.

## 2017-11-07 NOTE — ED Triage Notes (Signed)
Pt fell this morning coming out of the dining room. Is complaining of left knee pain. Employee from nursing home would also like pt to be checked for a UTI. Pt has bruising to right eye. Pt takes 81 mg of Aspirin everyday.

## 2017-11-07 NOTE — ED Notes (Addendum)
Attempted to ambulate pt in room. Pt c/o pain in left knee with ambulation. Pt able to take a few steps assisted but unsteady on feet.

## 2017-11-11 LAB — URINE CULTURE: SPECIAL REQUESTS: NORMAL

## 2017-11-12 ENCOUNTER — Telehealth: Payer: Self-pay | Admitting: Emergency Medicine

## 2017-11-12 LAB — CULTURE, BLOOD (ROUTINE X 2)
Culture: NO GROWTH
Culture: NO GROWTH

## 2017-11-12 NOTE — Telephone Encounter (Signed)
Post ED Visit - Positive Culture Follow-up  Culture report reviewed by antimicrobial stewardship pharmacist:  []  Elenor Quinones, Pharm.D. []  Heide Guile, Pharm.D., BCPS AQ-ID []  Parks Neptune, Pharm.D., BCPS []  Alycia Rossetti, Pharm.D., BCPS []  [place], Pharm.D., BCPS, AAHIVP []  Legrand Como, Pharm.D., BCPS, AAHIVP [x]  Salome Arnt, PharmD, BCPS []  Johnnette Gourd, PharmD, BCPS []  Hughes Better, PharmD, BCPS []  Leeroy Cha, PharmD  Positive urine culture Treated with cephalexin, organism sensitive to the same and no further patient follow-up is required at this time.  Hazle Nordmann 11/12/2017, 11:20 AM

## 2017-11-17 DIAGNOSIS — S8002XD Contusion of left knee, subsequent encounter: Secondary | ICD-10-CM | POA: Diagnosis not present

## 2017-11-17 DIAGNOSIS — N390 Urinary tract infection, site not specified: Secondary | ICD-10-CM | POA: Diagnosis not present

## 2017-11-17 DIAGNOSIS — W000XXD Fall on same level due to ice and snow, subsequent encounter: Secondary | ICD-10-CM | POA: Diagnosis not present

## 2017-11-17 DIAGNOSIS — S00211D Abrasion of right eyelid and periocular area, subsequent encounter: Secondary | ICD-10-CM | POA: Diagnosis not present

## 2017-11-21 DIAGNOSIS — R2689 Other abnormalities of gait and mobility: Secondary | ICD-10-CM | POA: Diagnosis not present

## 2017-11-21 DIAGNOSIS — Z9181 History of falling: Secondary | ICD-10-CM | POA: Diagnosis not present

## 2017-11-21 DIAGNOSIS — F0390 Unspecified dementia without behavioral disturbance: Secondary | ICD-10-CM | POA: Diagnosis not present

## 2017-11-21 DIAGNOSIS — Z87440 Personal history of urinary (tract) infections: Secondary | ICD-10-CM | POA: Diagnosis not present

## 2017-11-24 DIAGNOSIS — Z23 Encounter for immunization: Secondary | ICD-10-CM | POA: Diagnosis not present

## 2017-11-30 DIAGNOSIS — F0390 Unspecified dementia without behavioral disturbance: Secondary | ICD-10-CM | POA: Diagnosis not present

## 2017-11-30 DIAGNOSIS — Z87440 Personal history of urinary (tract) infections: Secondary | ICD-10-CM | POA: Diagnosis not present

## 2017-11-30 DIAGNOSIS — Z9181 History of falling: Secondary | ICD-10-CM | POA: Diagnosis not present

## 2017-11-30 DIAGNOSIS — R2689 Other abnormalities of gait and mobility: Secondary | ICD-10-CM | POA: Diagnosis not present

## 2017-12-01 DIAGNOSIS — Z87440 Personal history of urinary (tract) infections: Secondary | ICD-10-CM | POA: Diagnosis not present

## 2017-12-01 DIAGNOSIS — R2689 Other abnormalities of gait and mobility: Secondary | ICD-10-CM | POA: Diagnosis not present

## 2017-12-01 DIAGNOSIS — F0390 Unspecified dementia without behavioral disturbance: Secondary | ICD-10-CM | POA: Diagnosis not present

## 2017-12-01 DIAGNOSIS — Z9181 History of falling: Secondary | ICD-10-CM | POA: Diagnosis not present

## 2017-12-05 DIAGNOSIS — F0390 Unspecified dementia without behavioral disturbance: Secondary | ICD-10-CM | POA: Diagnosis not present

## 2017-12-05 DIAGNOSIS — Z9181 History of falling: Secondary | ICD-10-CM | POA: Diagnosis not present

## 2017-12-05 DIAGNOSIS — Z87440 Personal history of urinary (tract) infections: Secondary | ICD-10-CM | POA: Diagnosis not present

## 2017-12-05 DIAGNOSIS — R2689 Other abnormalities of gait and mobility: Secondary | ICD-10-CM | POA: Diagnosis not present

## 2017-12-07 DIAGNOSIS — R2689 Other abnormalities of gait and mobility: Secondary | ICD-10-CM | POA: Diagnosis not present

## 2017-12-07 DIAGNOSIS — Z87440 Personal history of urinary (tract) infections: Secondary | ICD-10-CM | POA: Diagnosis not present

## 2017-12-07 DIAGNOSIS — F0390 Unspecified dementia without behavioral disturbance: Secondary | ICD-10-CM | POA: Diagnosis not present

## 2017-12-07 DIAGNOSIS — Z9181 History of falling: Secondary | ICD-10-CM | POA: Diagnosis not present

## 2017-12-12 DIAGNOSIS — Z87440 Personal history of urinary (tract) infections: Secondary | ICD-10-CM | POA: Diagnosis not present

## 2017-12-12 DIAGNOSIS — R2689 Other abnormalities of gait and mobility: Secondary | ICD-10-CM | POA: Diagnosis not present

## 2017-12-12 DIAGNOSIS — Z9181 History of falling: Secondary | ICD-10-CM | POA: Diagnosis not present

## 2017-12-12 DIAGNOSIS — F0390 Unspecified dementia without behavioral disturbance: Secondary | ICD-10-CM | POA: Diagnosis not present

## 2017-12-15 DIAGNOSIS — Z9181 History of falling: Secondary | ICD-10-CM | POA: Diagnosis not present

## 2017-12-15 DIAGNOSIS — F0390 Unspecified dementia without behavioral disturbance: Secondary | ICD-10-CM | POA: Diagnosis not present

## 2017-12-15 DIAGNOSIS — Z87440 Personal history of urinary (tract) infections: Secondary | ICD-10-CM | POA: Diagnosis not present

## 2017-12-15 DIAGNOSIS — R2689 Other abnormalities of gait and mobility: Secondary | ICD-10-CM | POA: Diagnosis not present

## 2017-12-19 DIAGNOSIS — Z9181 History of falling: Secondary | ICD-10-CM | POA: Diagnosis not present

## 2017-12-19 DIAGNOSIS — Z87440 Personal history of urinary (tract) infections: Secondary | ICD-10-CM | POA: Diagnosis not present

## 2017-12-19 DIAGNOSIS — F0390 Unspecified dementia without behavioral disturbance: Secondary | ICD-10-CM | POA: Diagnosis not present

## 2017-12-19 DIAGNOSIS — R2689 Other abnormalities of gait and mobility: Secondary | ICD-10-CM | POA: Diagnosis not present

## 2017-12-21 DIAGNOSIS — Z9181 History of falling: Secondary | ICD-10-CM | POA: Diagnosis not present

## 2017-12-21 DIAGNOSIS — R2689 Other abnormalities of gait and mobility: Secondary | ICD-10-CM | POA: Diagnosis not present

## 2017-12-21 DIAGNOSIS — F0390 Unspecified dementia without behavioral disturbance: Secondary | ICD-10-CM | POA: Diagnosis not present

## 2017-12-21 DIAGNOSIS — Z87440 Personal history of urinary (tract) infections: Secondary | ICD-10-CM | POA: Diagnosis not present

## 2017-12-23 DIAGNOSIS — F0390 Unspecified dementia without behavioral disturbance: Secondary | ICD-10-CM | POA: Diagnosis not present

## 2018-01-24 DIAGNOSIS — M79675 Pain in left toe(s): Secondary | ICD-10-CM | POA: Diagnosis not present

## 2018-01-24 DIAGNOSIS — B351 Tinea unguium: Secondary | ICD-10-CM | POA: Diagnosis not present

## 2018-01-24 DIAGNOSIS — M79674 Pain in right toe(s): Secondary | ICD-10-CM | POA: Diagnosis not present

## 2018-02-01 DIAGNOSIS — R2689 Other abnormalities of gait and mobility: Secondary | ICD-10-CM | POA: Diagnosis not present

## 2018-02-21 DIAGNOSIS — L259 Unspecified contact dermatitis, unspecified cause: Secondary | ICD-10-CM | POA: Diagnosis not present

## 2018-02-21 DIAGNOSIS — R8290 Unspecified abnormal findings in urine: Secondary | ICD-10-CM | POA: Diagnosis not present

## 2018-02-21 DIAGNOSIS — F0390 Unspecified dementia without behavioral disturbance: Secondary | ICD-10-CM | POA: Diagnosis not present

## 2018-02-21 DIAGNOSIS — Z0001 Encounter for general adult medical examination with abnormal findings: Secondary | ICD-10-CM | POA: Diagnosis not present

## 2018-02-21 DIAGNOSIS — T7840XA Allergy, unspecified, initial encounter: Secondary | ICD-10-CM | POA: Diagnosis not present

## 2018-02-21 DIAGNOSIS — Z0000 Encounter for general adult medical examination without abnormal findings: Secondary | ICD-10-CM | POA: Diagnosis not present

## 2018-02-21 DIAGNOSIS — R32 Unspecified urinary incontinence: Secondary | ICD-10-CM | POA: Diagnosis not present

## 2018-02-21 DIAGNOSIS — F0391 Unspecified dementia with behavioral disturbance: Secondary | ICD-10-CM | POA: Diagnosis not present

## 2018-02-21 DIAGNOSIS — E7800 Pure hypercholesterolemia, unspecified: Secondary | ICD-10-CM | POA: Diagnosis not present

## 2018-02-21 DIAGNOSIS — R739 Hyperglycemia, unspecified: Secondary | ICD-10-CM | POA: Diagnosis not present

## 2018-02-21 DIAGNOSIS — R300 Dysuria: Secondary | ICD-10-CM | POA: Diagnosis not present

## 2018-02-21 DIAGNOSIS — Z1331 Encounter for screening for depression: Secondary | ICD-10-CM | POA: Diagnosis not present

## 2018-02-21 DIAGNOSIS — Z1389 Encounter for screening for other disorder: Secondary | ICD-10-CM | POA: Diagnosis not present

## 2018-03-06 DIAGNOSIS — F0390 Unspecified dementia without behavioral disturbance: Secondary | ICD-10-CM | POA: Diagnosis not present

## 2018-03-06 DIAGNOSIS — R32 Unspecified urinary incontinence: Secondary | ICD-10-CM | POA: Diagnosis not present

## 2018-03-06 DIAGNOSIS — N390 Urinary tract infection, site not specified: Secondary | ICD-10-CM | POA: Diagnosis not present

## 2018-03-24 ENCOUNTER — Other Ambulatory Visit: Payer: Self-pay

## 2018-03-24 ENCOUNTER — Encounter (HOSPITAL_COMMUNITY): Payer: Self-pay

## 2018-03-24 ENCOUNTER — Emergency Department (HOSPITAL_COMMUNITY)
Admission: EM | Admit: 2018-03-24 | Discharge: 2018-03-24 | Disposition: A | Discharge status: Other Acute Inpatient Hospital | Payer: Medicare Other | Attending: Emergency Medicine | Admitting: Emergency Medicine

## 2018-03-24 DIAGNOSIS — H6692 Otitis media, unspecified, left ear: Secondary | ICD-10-CM | POA: Diagnosis not present

## 2018-03-24 DIAGNOSIS — N390 Urinary tract infection, site not specified: Secondary | ICD-10-CM | POA: Diagnosis not present

## 2018-03-24 DIAGNOSIS — R300 Dysuria: Secondary | ICD-10-CM | POA: Diagnosis present

## 2018-03-24 DIAGNOSIS — Z7982 Long term (current) use of aspirin: Secondary | ICD-10-CM | POA: Diagnosis not present

## 2018-03-24 DIAGNOSIS — H60502 Unspecified acute noninfective otitis externa, left ear: Secondary | ICD-10-CM | POA: Diagnosis not present

## 2018-03-24 DIAGNOSIS — R32 Unspecified urinary incontinence: Secondary | ICD-10-CM | POA: Diagnosis not present

## 2018-03-24 DIAGNOSIS — F0390 Unspecified dementia without behavioral disturbance: Secondary | ICD-10-CM | POA: Insufficient documentation

## 2018-03-24 DIAGNOSIS — R1030 Lower abdominal pain, unspecified: Secondary | ICD-10-CM | POA: Diagnosis not present

## 2018-03-24 DIAGNOSIS — Z79899 Other long term (current) drug therapy: Secondary | ICD-10-CM | POA: Insufficient documentation

## 2018-03-24 LAB — LACTIC ACID, PLASMA
LACTIC ACID, VENOUS: 0.9 mmol/L (ref 0.5–1.9)
Lactic Acid, Venous: 1.1 mmol/L (ref 0.5–1.9)

## 2018-03-24 LAB — CBC WITH DIFFERENTIAL/PLATELET
ABS IMMATURE GRANULOCYTES: 0.03 10*3/uL (ref 0.00–0.07)
BASOS PCT: 0 %
Basophils Absolute: 0 10*3/uL (ref 0.0–0.1)
EOS PCT: 3 %
Eosinophils Absolute: 0.2 10*3/uL (ref 0.0–0.5)
HCT: 38.9 % (ref 36.0–46.0)
HEMOGLOBIN: 12.3 g/dL (ref 12.0–15.0)
IMMATURE GRANULOCYTES: 0 %
LYMPHS PCT: 28 %
Lymphs Abs: 2.2 10*3/uL (ref 0.7–4.0)
MCH: 29.9 pg (ref 26.0–34.0)
MCHC: 31.6 g/dL (ref 30.0–36.0)
MCV: 94.6 fL (ref 80.0–100.0)
Monocytes Absolute: 0.7 10*3/uL (ref 0.1–1.0)
Monocytes Relative: 9 %
NRBC: 0 % (ref 0.0–0.2)
Neutro Abs: 4.7 10*3/uL (ref 1.7–7.7)
Neutrophils Relative %: 60 %
PLATELETS: 338 10*3/uL (ref 150–400)
RBC: 4.11 MIL/uL (ref 3.87–5.11)
RDW: 13.9 % (ref 11.5–15.5)
WBC: 7.9 10*3/uL (ref 4.0–10.5)

## 2018-03-24 LAB — URINALYSIS, ROUTINE W REFLEX MICROSCOPIC
Bilirubin Urine: NEGATIVE
GLUCOSE, UA: NEGATIVE mg/dL
Ketones, ur: NEGATIVE mg/dL
Leukocytes, UA: NEGATIVE
NITRITE: NEGATIVE
PH: 8 (ref 5.0–8.0)
Protein, ur: 100 mg/dL — AB
RBC / HPF: >50 RBC/hpf — ABNORMAL HIGH (ref 0–5)
Specific Gravity, Urine: 1.011 (ref 1.005–1.030)
WBC, UA: >50 WBC/hpf — ABNORMAL HIGH (ref 0–5)

## 2018-03-24 LAB — BASIC METABOLIC PANEL
ANION GAP: 7 (ref 5–15)
BUN: 26 mg/dL — ABNORMAL HIGH (ref 8–23)
CO2: 24 mmol/L (ref 22–32)
Calcium: 8.9 mg/dL (ref 8.9–10.3)
Chloride: 107 mmol/L (ref 98–111)
Creatinine, Ser: 0.83 mg/dL (ref 0.44–1.00)
GFR, EST NON AFRICAN AMERICAN: 60 mL/min — AB (ref >60)
GLUCOSE: 99 mg/dL (ref 70–99)
Potassium: 3.8 mmol/L (ref 3.5–5.1)
Sodium: 138 mmol/L (ref 135–145)

## 2018-03-24 MED ORDER — SODIUM CHLORIDE 0.9 % IV SOLN
1.0000 g | Freq: Once | INTRAVENOUS | Status: AC
  Administered 2018-03-24: 1 g via INTRAVENOUS
  Filled 2018-03-24: qty 10

## 2018-03-24 MED ORDER — OFLOXACIN 0.3 % OT SOLN: 10.0000 [drp] | Freq: Every day | OTIC | 0 refills | Status: AC

## 2018-03-24 MED ORDER — CEFTRIAXONE SODIUM 1 G IJ SOLR
INTRAMUSCULAR | Status: AC
  Filled 2018-03-24: qty 10

## 2018-03-24 MED ORDER — CEFTRIAXONE SODIUM 500 MG IJ SOLR
500.0000 mg | Freq: Once | INTRAMUSCULAR | Status: AC
  Administered 2018-03-24: 500 mg via INTRAMUSCULAR

## 2018-03-24 MED ORDER — CEFDINIR 300 MG PO CAPS: 300.0000 mg | ORAL_CAPSULE | Freq: Two times a day (BID) | ORAL | 0 refills | Status: AC

## 2018-03-24 MED ORDER — LIDOCAINE HCL (PF) 1 % IJ SOLN
INTRAMUSCULAR | Status: AC
  Administered 2018-03-24: 17:00:00
  Filled 2018-03-24: qty 2

## 2018-03-24 NOTE — ED Provider Notes (Signed)
[facility] EMERGENCY DEPARTMENT Provider Note   CSN: [id number] Arrival date & time: 03/24/18  1153     History   Chief Complaint Chief Complaint  Patient presents with  . Dysuria    HPI Judy Stevenson is a 85 y.o. female.  The history is provided by the nursing home and a caregiver. The history is limited by the condition of the patient (Hx dementia).  Dysuria  Pt was seen at 1330. Per NH report and pt's caregiver: Pt was evaluated by PMD today for "foul smelling urine" which has been present for the past several weeks. Pt was dx with UTI and finished full course of bactrim on 03/11/18.  Pt also dx UTI in 01/2018, and took a full course of cipro. NH does not feel pt is improving after taking 2 courses of abx. NH also states pt is having "mucus" from her left ear for unknown period of time.    Past Medical History:  Diagnosis Date  . Dementia ([place])   . GERD (gastroesophageal reflux disease)     There are no active problems to display for this patient.   Past Surgical History:  Procedure Laterality Date  . CATARACT EXTRACTION W/PHACO Right 03/04/2015   Procedure: CATARACT EXTRACTION PHACO AND INTRAOCULAR LENS PLACEMENT (IOC);  Surgeon: Rutherford Guys, MD;  Location: [facility];  Service: Ophthalmology;  Laterality: Right;  CDE:45.80     OB History   No obstetric history on file.      Home Medications    Prior to Admission medications   Medication Sig Start Date End Date Taking? Authorizing Provider  acetaminophen 325 MG tablet Take 2 tablets (650 mg total) by mouth every 6 (six) hours as needed for mild pain or moderate pain. 12/15/14   Ward, Delice Bison, DO  aspirin EC 81 MG tablet Take 81 mg by mouth daily.    [provider]  calcium-vitamin D (OSCAL WITH D) 500-200 MG-UNIT tablet Take 1 tablet by mouth 3 (three) times daily.    [provider]  Dentifrices (BIOTENE DRY MOUTH) GEL Place 1 application onto teeth at bedtime. As directed to brush teeth  before bed    [provider]  Multiple Vitamin (DAILY VITE) TABS Take 1 tablet by mouth 2 (two) times daily.    [provider]  oxybutynin (DITROPAN) 5 MG tablet Take 5 mg by mouth 2 (two) times daily.    [provider]  pantoprazole (PROTONIX) 40 MG tablet Take 40 mg by mouth daily.    [provider]  sennosides-docusate sodium (SENOKOT-S) 8.6-50 MG tablet Take 1 tablet by mouth at bedtime.    [provider]  silver sulfADIAZINE (SILVADENE) 1 % cream Apply 1 application topically 2 (two) times daily as needed (to affected areas of the back).    [provider]    Family History No family history on file.  Social History Social History   Tobacco Use  . Smoking status: Never Smoker  . Smokeless tobacco: Never Used  Substance Use Topics  . Alcohol use: No  . Drug use: No     Allergies   Patient has no known allergies.   Review of Systems Review of Systems  Unable to perform ROS: Dementia  Genitourinary: Positive for dysuria.     Physical Exam Updated Vital Signs BP 136/70 (BP Location: Right Arm)   Pulse 93   Temp 97.8 F (36.6 C) (Oral)   Resp 20   Wt 64.4 kg  SpO2 95%   BMI 24.37 kg/m     16:20 Orthostatic Vital Signs VB  Orthostatic Lying   BP- Lying: 130/76  Pulse- Lying: 91      Orthostatic Sitting  BP- Sitting: 140/108Abnormal   Pulse- Sitting: 80      Orthostatic Standing at 0 minutes  BP- Standing at 0 minutes: 140/95Abnormal   Pulse- Standing at 0 minutes: 62     Physical Exam 1335: Physical examination:  Nursing notes reviewed; Vital signs and O2 SAT reviewed;  Constitutional: Well developed, Well nourished, In no acute distress; Head:  Normocephalic, atraumatic. No mastoid tenderness bilat.; Eyes: EOMI, PERRL, No scleral icterus; ENMT: Right TM clear. +left: purulence behind left TM and in canal, canal mildly edematous and with debris, no blood, no obvious injuries, +Unable to fully  visualize entire left TM. Mouth and pharynx normal, Mucous membranes dry; Neck: Supple, Full range of motion, No lymphadenopathy; Cardiovascular: Regular rate and rhythm, No gallop; Respiratory: Breath sounds clear & equal bilaterally, No wheezes. Normal respiratory effort/excursion; Chest: Nontender, Movement normal; Abdomen: Soft, Nontender, Nondistended, Normal bowel sounds; Genitourinary: No CVA tenderness; Extremities: Peripheral pulses normal, No tenderness, No edema, No calf edema or asymmetry.; Neuro: Awake, alert. No facial droop. Speech minimal. Moves all extremities spontaneously and to command without apparent gross focal motor deficits.; Skin: Color normal, Warm, Dry.   ED Treatments / Results  Labs (all labs ordered are listed, but only abnormal results are displayed)   EKG None  Radiology   Procedures Procedures (including critical care time)  Medications Ordered in ED Medications - No data to display   Initial Impression / Assessment and Plan / ED Course  I have reviewed the triage vital signs and the nursing notes.  Pertinent labs & imaging results that were available during my care of the patient were reviewed by me and considered in my medical decision making (see chart for details).  MDM Reviewed: previous chart, nursing note and vitals Reviewed previous: labs Interpretation: labs   Results for orders placed or performed during the hospital encounter of 03/24/18  Urinalysis, Routine w reflex microscopic  Result Value Ref Range   Color, Urine AMBER (A) YELLOW   APPearance CLOUDY (A) CLEAR   Specific Gravity, Urine 1.011 1.005 - 1.030   pH 8.0 5.0 - 8.0   Glucose, UA NEGATIVE NEGATIVE mg/dL   Hgb urine dipstick MODERATE (A) NEGATIVE   Bilirubin Urine NEGATIVE NEGATIVE   Ketones, ur NEGATIVE NEGATIVE mg/dL   Protein, ur 100 (A) NEGATIVE mg/dL   Nitrite NEGATIVE NEGATIVE   Leukocytes, UA NEGATIVE NEGATIVE   RBC / HPF >50 (H) 0 - 5 RBC/hpf   WBC, UA >50  (H) 0 - 5 WBC/hpf   Bacteria, UA MANY (A) NONE SEEN   Squamous Epithelial / LPF 0-5 0 - 5   WBC Clumps PRESENT    Budding Yeast PRESENT   Basic metabolic panel  Result Value Ref Range   Sodium 138 135 - 145 mmol/L   Potassium 3.8 3.5 - 5.1 mmol/L   Chloride 107 98 - 111 mmol/L   CO2 24 22 - 32 mmol/L   Glucose, Bld 99 70 - 99 mg/dL   BUN 26 (H) 8 - 23 mg/dL   Creatinine, Ser 0.83 0.44 - 1.00 mg/dL   Calcium 8.9 8.9 - 10.3 mg/dL   GFR calc non Af Amer 60 (L) >60 mL/min   GFR calc Af Amer >60 >60 mL/min   Anion gap 7 5 - 15  Lactic acid, plasma  Result Value Ref Range   Lactic Acid, Venous 1.1 0.5 - 1.9 mmol/L  Lactic acid, plasma  Result Value Ref Range   Lactic Acid, Venous 0.9 0.5 - 1.9 mmol/L  CBC with Differential  Result Value Ref Range   WBC 7.9 4.0 - 10.5 K/uL   RBC 4.11 3.87 - 5.11 MIL/uL   Hemoglobin 12.3 12.0 - 15.0 g/dL   HCT 38.9 36.0 - 46.0 %   MCV 94.6 80.0 - 100.0 fL   MCH 29.9 26.0 - 34.0 pg   MCHC 31.6 30.0 - 36.0 g/dL   RDW 13.9 11.5 - 15.5 %   Platelets 338 150 - 400 K/uL   nRBC 0.0 0.0 - 0.2 %   Neutrophils Relative % 60 %   Neutro Abs 4.7 1.7 - 7.7 K/uL   Lymphocytes Relative 28 %   Lymphs Abs 2.2 0.7 - 4.0 K/uL   Monocytes Relative 9 %   Monocytes Absolute 0.7 0.1 - 1.0 K/uL   Eosinophils Relative 3 %   Eosinophils Absolute 0.2 0.0 - 0.5 K/uL   Basophils Relative 0 %   Basophils Absolute 0.0 0.0 - 0.1 K/uL   Immature Granulocytes 0 %   Abs Immature Granulocytes 0.03 0.00 - 0.07 K/uL    1630:  Pt not orthostatic on VS. WBC count and lactic acid x2 normal. +UTI, UC pending; IV rocephin given. Pt also has acute left otitis media (I cannot visualize entire TM) as well as otitis externa. No clear indication for admission at this time. Tx with omnicef, ofloxacin otic, f/u PMD in 2 days. Will d/c back to NH stable.     Final Clinical Impressions(s) / ED Diagnoses   Final diagnoses:  None    ED Discharge Orders    None       Francine Graven, DO 03/26/18 1410

## 2018-03-24 NOTE — ED Notes (Signed)
Caregiver from [facility] arrived to transport patient home. She refused to sign for discharge or give me her name. Advised caregiver that patient has dementia and is not able to sign her discharge. Caregiver states "I'm not signing it, she can sign it. Give it to her."

## 2018-03-24 NOTE — ED Triage Notes (Signed)
Pt is a resident of [facility]. Went to see Dr. Legrand Rams today and sent here for bloody urinary discharge and foul smelling urine. Been on different antibiotics for a UTI and finished on the 18th with no relief.

## 2018-03-24 NOTE — Discharge Instructions (Addendum)
Take the prescriptions as directed.  Call your regular medical doctor tomorrow to schedule a follow up appointment within the next 2 days.  Return to the Emergency Department immediately sooner if worsening.  ° °

## 2018-03-24 NOTE — ED Notes (Signed)
Patient complained of pain to IV site. Noticed large lump below IV site where IV has infiltrated. Patient has received approximately 500 mg of rocephin via IV. Advised Dr Thurnell Garbe. Verbal order to give other 500 mg as IM injection. Verified order with pharmacy and advised of infiltration to IV site.

## 2018-03-24 NOTE — ED Notes (Signed)
Called [facility] and spoke with nurse who states patient finished bactrim on Jan 18 and also was on Cipro in December for UTI.

## 2018-03-24 NOTE — ED Notes (Signed)
Caregiver from [facility] states she is leaving and wants to leave number to call if needed and to pick up patient when discharged. Name is Tyson Alias, number is [phone].

## 2018-03-24 NOTE — ED Notes (Signed)
Dressed patient and she is lying in bed waiting for [facility] to come transport her back to the facility.

## 2018-03-27 DIAGNOSIS — R32 Unspecified urinary incontinence: Secondary | ICD-10-CM | POA: Diagnosis not present

## 2018-03-27 DIAGNOSIS — N390 Urinary tract infection, site not specified: Secondary | ICD-10-CM | POA: Diagnosis not present

## 2018-03-27 DIAGNOSIS — F0390 Unspecified dementia without behavioral disturbance: Secondary | ICD-10-CM | POA: Diagnosis not present

## 2018-03-27 DIAGNOSIS — H6690 Otitis media, unspecified, unspecified ear: Secondary | ICD-10-CM | POA: Diagnosis not present

## 2018-03-27 LAB — URINE CULTURE

## 2018-03-28 ENCOUNTER — Telehealth: Payer: Self-pay | Admitting: *Deleted

## 2018-03-28 DIAGNOSIS — M79675 Pain in left toe(s): Secondary | ICD-10-CM | POA: Diagnosis not present

## 2018-03-28 DIAGNOSIS — B351 Tinea unguium: Secondary | ICD-10-CM | POA: Diagnosis not present

## 2018-03-28 DIAGNOSIS — M79674 Pain in right toe(s): Secondary | ICD-10-CM | POA: Diagnosis not present

## 2018-03-28 NOTE — Progress Notes (Signed)
ED Antimicrobial Stewardship Positive Culture Follow Up   Judy Stevenson is an 85 y.o. female who presented to [facility] on 03/24/2018 with a chief complaint of  Chief Complaint  Patient presents with  . Dysuria    Recent Results (from the past 720 hour(s))  Urine culture     Status: Abnormal   Collection Time: 03/24/18  1:37 PM  Result Value Ref Range Status   Specimen Description   Final    URINE, CLEAN CATCH Performed at [facility], [address], [place], [place] [address]    Special Requests   Final    NONE Performed at [facility], [address], [place], [place] [address]    Culture (A)  Final    >=100,000 COLONIES/mL AEROCOCCUS VIRIDANS Standardized susceptibility testing for this organism is not available. Performed at [facility], [place] [address], [place], [place] [address]    Report Status 03/27/2018 FINAL  Final    [x]  Treated with Bactrim/cefdinir, organism resistant to prescribed antimicrobial  New antibiotic prescription: D/c Bactrim/Cefdinir. Start Augmentin 875-125mg  PO BID x 7 days  ED Provider: Lenn Sink, PA-C   Romona Curls 03/28/2018, 9:39 AM Clinical Pharmacist Monday - Friday phone -  [phone] Saturday - Sunday phone - [phone]

## 2018-03-28 NOTE — Telephone Encounter (Signed)
Post ED Visit - Positive Culture Follow-up: Successful Patient Follow-Up  Culture assessed and recommendations reviewed by:  []  Elenor Quinones, Pharm.D. []  Heide Guile, Pharm.D., BCPS AQ-ID []  Parks Neptune, Pharm.D., BCPS []  Alycia Rossetti, Pharm.D., BCPS []  Pollock, Pharm.D., BCPS, AAHIVP []  Legrand Como, Pharm.D., BCPS, AAHIVP []  Salome Arnt, PharmD, BCPS []  Johnnette Gourd, PharmD, BCPS []  Hughes Better, PharmD, BCPS []  Leeroy Cha, PharmD Waldo Laine, PharmD  Positive urine culture  []  Patient discharged without antimicrobial prescription and treatment is now indicated [x]  Organism is resistant to prescribed ED discharge antimicrobial []  Patient with positive blood cultures  Changes discussed with ED provider Lavell Luster, PA New antibiotic prescription D/C Bactrim, Start Augmentin 875-125mg  PO q12hours x 7 days Faxed to [facility], [place]  Contacted patient, date 03/28/2018, time [place], [place] 03/28/2018, 10:10 AM

## 2018-03-30 DIAGNOSIS — R3981 Functional urinary incontinence: Secondary | ICD-10-CM | POA: Diagnosis not present

## 2018-03-30 DIAGNOSIS — F0390 Unspecified dementia without behavioral disturbance: Secondary | ICD-10-CM | POA: Diagnosis not present

## 2018-03-30 DIAGNOSIS — R2689 Other abnormalities of gait and mobility: Secondary | ICD-10-CM | POA: Diagnosis not present

## 2018-03-30 DIAGNOSIS — N390 Urinary tract infection, site not specified: Secondary | ICD-10-CM | POA: Diagnosis not present

## 2018-03-30 DIAGNOSIS — H6502 Acute serous otitis media, left ear: Secondary | ICD-10-CM | POA: Diagnosis not present

## 2018-03-31 DIAGNOSIS — H6502 Acute serous otitis media, left ear: Secondary | ICD-10-CM | POA: Diagnosis not present

## 2018-03-31 DIAGNOSIS — F0390 Unspecified dementia without behavioral disturbance: Secondary | ICD-10-CM | POA: Diagnosis not present

## 2018-03-31 DIAGNOSIS — R3981 Functional urinary incontinence: Secondary | ICD-10-CM | POA: Diagnosis not present

## 2018-03-31 DIAGNOSIS — N390 Urinary tract infection, site not specified: Secondary | ICD-10-CM | POA: Diagnosis not present

## 2018-03-31 DIAGNOSIS — R2689 Other abnormalities of gait and mobility: Secondary | ICD-10-CM | POA: Diagnosis not present

## 2018-04-03 DIAGNOSIS — F0390 Unspecified dementia without behavioral disturbance: Secondary | ICD-10-CM | POA: Diagnosis not present

## 2018-04-03 DIAGNOSIS — N390 Urinary tract infection, site not specified: Secondary | ICD-10-CM | POA: Diagnosis not present

## 2018-04-03 DIAGNOSIS — R3981 Functional urinary incontinence: Secondary | ICD-10-CM | POA: Diagnosis not present

## 2018-04-03 DIAGNOSIS — R2689 Other abnormalities of gait and mobility: Secondary | ICD-10-CM | POA: Diagnosis not present

## 2018-04-03 DIAGNOSIS — H6502 Acute serous otitis media, left ear: Secondary | ICD-10-CM | POA: Diagnosis not present

## 2018-04-04 DIAGNOSIS — R3981 Functional urinary incontinence: Secondary | ICD-10-CM | POA: Diagnosis not present

## 2018-04-04 DIAGNOSIS — R2689 Other abnormalities of gait and mobility: Secondary | ICD-10-CM | POA: Diagnosis not present

## 2018-04-04 DIAGNOSIS — H6502 Acute serous otitis media, left ear: Secondary | ICD-10-CM | POA: Diagnosis not present

## 2018-04-04 DIAGNOSIS — N390 Urinary tract infection, site not specified: Secondary | ICD-10-CM | POA: Diagnosis not present

## 2018-04-04 DIAGNOSIS — F0390 Unspecified dementia without behavioral disturbance: Secondary | ICD-10-CM | POA: Diagnosis not present

## 2018-04-05 DIAGNOSIS — N390 Urinary tract infection, site not specified: Secondary | ICD-10-CM | POA: Diagnosis not present

## 2018-04-05 DIAGNOSIS — H6502 Acute serous otitis media, left ear: Secondary | ICD-10-CM | POA: Diagnosis not present

## 2018-04-05 DIAGNOSIS — R3981 Functional urinary incontinence: Secondary | ICD-10-CM | POA: Diagnosis not present

## 2018-04-05 DIAGNOSIS — R2689 Other abnormalities of gait and mobility: Secondary | ICD-10-CM | POA: Diagnosis not present

## 2018-04-05 DIAGNOSIS — F0390 Unspecified dementia without behavioral disturbance: Secondary | ICD-10-CM | POA: Diagnosis not present

## 2018-04-06 DIAGNOSIS — R2689 Other abnormalities of gait and mobility: Secondary | ICD-10-CM | POA: Diagnosis not present

## 2018-04-06 DIAGNOSIS — H6502 Acute serous otitis media, left ear: Secondary | ICD-10-CM | POA: Diagnosis not present

## 2018-04-06 DIAGNOSIS — R3981 Functional urinary incontinence: Secondary | ICD-10-CM | POA: Diagnosis not present

## 2018-04-06 DIAGNOSIS — F0390 Unspecified dementia without behavioral disturbance: Secondary | ICD-10-CM | POA: Diagnosis not present

## 2018-04-06 DIAGNOSIS — N390 Urinary tract infection, site not specified: Secondary | ICD-10-CM | POA: Diagnosis not present

## 2018-04-07 DIAGNOSIS — D649 Anemia, unspecified: Secondary | ICD-10-CM | POA: Diagnosis not present

## 2018-04-07 DIAGNOSIS — R32 Unspecified urinary incontinence: Secondary | ICD-10-CM | POA: Diagnosis not present

## 2018-04-09 DIAGNOSIS — H6502 Acute serous otitis media, left ear: Secondary | ICD-10-CM | POA: Diagnosis not present

## 2018-04-09 DIAGNOSIS — R2689 Other abnormalities of gait and mobility: Secondary | ICD-10-CM | POA: Diagnosis not present

## 2018-04-09 DIAGNOSIS — R3981 Functional urinary incontinence: Secondary | ICD-10-CM | POA: Diagnosis not present

## 2018-04-09 DIAGNOSIS — F0390 Unspecified dementia without behavioral disturbance: Secondary | ICD-10-CM | POA: Diagnosis not present

## 2018-04-09 DIAGNOSIS — N390 Urinary tract infection, site not specified: Secondary | ICD-10-CM | POA: Diagnosis not present

## 2018-04-11 DIAGNOSIS — N390 Urinary tract infection, site not specified: Secondary | ICD-10-CM | POA: Diagnosis not present

## 2018-04-11 DIAGNOSIS — R2689 Other abnormalities of gait and mobility: Secondary | ICD-10-CM | POA: Diagnosis not present

## 2018-04-11 DIAGNOSIS — H6502 Acute serous otitis media, left ear: Secondary | ICD-10-CM | POA: Diagnosis not present

## 2018-04-11 DIAGNOSIS — F0390 Unspecified dementia without behavioral disturbance: Secondary | ICD-10-CM | POA: Diagnosis not present

## 2018-04-11 DIAGNOSIS — R3981 Functional urinary incontinence: Secondary | ICD-10-CM | POA: Diagnosis not present

## 2018-04-12 DIAGNOSIS — N390 Urinary tract infection, site not specified: Secondary | ICD-10-CM | POA: Diagnosis not present

## 2018-04-12 DIAGNOSIS — R3981 Functional urinary incontinence: Secondary | ICD-10-CM | POA: Diagnosis not present

## 2018-04-12 DIAGNOSIS — F0390 Unspecified dementia without behavioral disturbance: Secondary | ICD-10-CM | POA: Diagnosis not present

## 2018-04-12 DIAGNOSIS — H6502 Acute serous otitis media, left ear: Secondary | ICD-10-CM | POA: Diagnosis not present

## 2018-04-12 DIAGNOSIS — R2689 Other abnormalities of gait and mobility: Secondary | ICD-10-CM | POA: Diagnosis not present

## 2018-04-17 DIAGNOSIS — N390 Urinary tract infection, site not specified: Secondary | ICD-10-CM | POA: Diagnosis not present

## 2018-04-17 DIAGNOSIS — R2689 Other abnormalities of gait and mobility: Secondary | ICD-10-CM | POA: Diagnosis not present

## 2018-04-17 DIAGNOSIS — J069 Acute upper respiratory infection, unspecified: Secondary | ICD-10-CM | POA: Diagnosis not present

## 2018-04-17 DIAGNOSIS — R3981 Functional urinary incontinence: Secondary | ICD-10-CM | POA: Diagnosis not present

## 2018-04-17 DIAGNOSIS — F0390 Unspecified dementia without behavioral disturbance: Secondary | ICD-10-CM | POA: Diagnosis not present

## 2018-04-17 DIAGNOSIS — H6502 Acute serous otitis media, left ear: Secondary | ICD-10-CM | POA: Diagnosis not present

## 2018-04-20 DIAGNOSIS — R2689 Other abnormalities of gait and mobility: Secondary | ICD-10-CM | POA: Diagnosis not present

## 2018-04-20 DIAGNOSIS — R3981 Functional urinary incontinence: Secondary | ICD-10-CM | POA: Diagnosis not present

## 2018-04-20 DIAGNOSIS — N390 Urinary tract infection, site not specified: Secondary | ICD-10-CM | POA: Diagnosis not present

## 2018-04-20 DIAGNOSIS — H6502 Acute serous otitis media, left ear: Secondary | ICD-10-CM | POA: Diagnosis not present

## 2018-04-20 DIAGNOSIS — F0390 Unspecified dementia without behavioral disturbance: Secondary | ICD-10-CM | POA: Diagnosis not present

## 2018-04-21 DIAGNOSIS — R2689 Other abnormalities of gait and mobility: Secondary | ICD-10-CM | POA: Diagnosis not present

## 2018-04-21 DIAGNOSIS — R3981 Functional urinary incontinence: Secondary | ICD-10-CM | POA: Diagnosis not present

## 2018-04-21 DIAGNOSIS — F0390 Unspecified dementia without behavioral disturbance: Secondary | ICD-10-CM | POA: Diagnosis not present

## 2018-04-21 DIAGNOSIS — H6502 Acute serous otitis media, left ear: Secondary | ICD-10-CM | POA: Diagnosis not present

## 2018-04-21 DIAGNOSIS — N390 Urinary tract infection, site not specified: Secondary | ICD-10-CM | POA: Diagnosis not present

## 2018-04-24 DIAGNOSIS — R3981 Functional urinary incontinence: Secondary | ICD-10-CM | POA: Diagnosis not present

## 2018-04-24 DIAGNOSIS — F0390 Unspecified dementia without behavioral disturbance: Secondary | ICD-10-CM | POA: Diagnosis not present

## 2018-04-24 DIAGNOSIS — R2689 Other abnormalities of gait and mobility: Secondary | ICD-10-CM | POA: Diagnosis not present

## 2018-04-24 DIAGNOSIS — N390 Urinary tract infection, site not specified: Secondary | ICD-10-CM | POA: Diagnosis not present

## 2018-04-24 DIAGNOSIS — H6502 Acute serous otitis media, left ear: Secondary | ICD-10-CM | POA: Diagnosis not present

## 2018-04-24 DIAGNOSIS — Z87440 Personal history of urinary (tract) infections: Secondary | ICD-10-CM | POA: Diagnosis not present

## 2018-04-28 DIAGNOSIS — R3981 Functional urinary incontinence: Secondary | ICD-10-CM | POA: Diagnosis not present

## 2018-04-28 DIAGNOSIS — R2689 Other abnormalities of gait and mobility: Secondary | ICD-10-CM | POA: Diagnosis not present

## 2018-04-28 DIAGNOSIS — N390 Urinary tract infection, site not specified: Secondary | ICD-10-CM | POA: Diagnosis not present

## 2018-04-28 DIAGNOSIS — F0390 Unspecified dementia without behavioral disturbance: Secondary | ICD-10-CM | POA: Diagnosis not present

## 2018-04-28 DIAGNOSIS — H6502 Acute serous otitis media, left ear: Secondary | ICD-10-CM | POA: Diagnosis not present

## 2018-04-29 DIAGNOSIS — N390 Urinary tract infection, site not specified: Secondary | ICD-10-CM | POA: Diagnosis not present

## 2018-04-29 DIAGNOSIS — R2689 Other abnormalities of gait and mobility: Secondary | ICD-10-CM | POA: Diagnosis not present

## 2018-04-29 DIAGNOSIS — R3981 Functional urinary incontinence: Secondary | ICD-10-CM | POA: Diagnosis not present

## 2018-04-29 DIAGNOSIS — F0390 Unspecified dementia without behavioral disturbance: Secondary | ICD-10-CM | POA: Diagnosis not present

## 2018-04-29 DIAGNOSIS — H6502 Acute serous otitis media, left ear: Secondary | ICD-10-CM | POA: Diagnosis not present

## 2018-05-01 DIAGNOSIS — R3981 Functional urinary incontinence: Secondary | ICD-10-CM | POA: Diagnosis not present

## 2018-05-01 DIAGNOSIS — N390 Urinary tract infection, site not specified: Secondary | ICD-10-CM | POA: Diagnosis not present

## 2018-05-01 DIAGNOSIS — F0390 Unspecified dementia without behavioral disturbance: Secondary | ICD-10-CM | POA: Diagnosis not present

## 2018-05-01 DIAGNOSIS — R2689 Other abnormalities of gait and mobility: Secondary | ICD-10-CM | POA: Diagnosis not present

## 2018-05-01 DIAGNOSIS — H6502 Acute serous otitis media, left ear: Secondary | ICD-10-CM | POA: Diagnosis not present

## 2018-05-11 DIAGNOSIS — N390 Urinary tract infection, site not specified: Secondary | ICD-10-CM | POA: Diagnosis not present

## 2018-05-11 DIAGNOSIS — F0390 Unspecified dementia without behavioral disturbance: Secondary | ICD-10-CM | POA: Diagnosis not present

## 2018-05-11 DIAGNOSIS — H6502 Acute serous otitis media, left ear: Secondary | ICD-10-CM | POA: Diagnosis not present

## 2018-05-11 DIAGNOSIS — R2689 Other abnormalities of gait and mobility: Secondary | ICD-10-CM | POA: Diagnosis not present

## 2018-05-11 DIAGNOSIS — R3981 Functional urinary incontinence: Secondary | ICD-10-CM | POA: Diagnosis not present

## 2018-05-15 DIAGNOSIS — F0390 Unspecified dementia without behavioral disturbance: Secondary | ICD-10-CM | POA: Diagnosis not present

## 2018-05-15 DIAGNOSIS — R3981 Functional urinary incontinence: Secondary | ICD-10-CM | POA: Diagnosis not present

## 2018-05-15 DIAGNOSIS — R2689 Other abnormalities of gait and mobility: Secondary | ICD-10-CM | POA: Diagnosis not present

## 2018-05-15 DIAGNOSIS — R32 Unspecified urinary incontinence: Secondary | ICD-10-CM | POA: Diagnosis not present

## 2018-05-15 DIAGNOSIS — H6502 Acute serous otitis media, left ear: Secondary | ICD-10-CM | POA: Diagnosis not present

## 2018-05-15 DIAGNOSIS — N390 Urinary tract infection, site not specified: Secondary | ICD-10-CM | POA: Diagnosis not present

## 2018-05-19 DIAGNOSIS — R32 Unspecified urinary incontinence: Secondary | ICD-10-CM | POA: Diagnosis not present

## 2018-05-24 DIAGNOSIS — R2689 Other abnormalities of gait and mobility: Secondary | ICD-10-CM | POA: Diagnosis not present

## 2018-05-24 DIAGNOSIS — R3981 Functional urinary incontinence: Secondary | ICD-10-CM | POA: Diagnosis not present

## 2018-05-24 DIAGNOSIS — N390 Urinary tract infection, site not specified: Secondary | ICD-10-CM | POA: Diagnosis not present

## 2018-05-24 DIAGNOSIS — H6502 Acute serous otitis media, left ear: Secondary | ICD-10-CM | POA: Diagnosis not present

## 2018-05-24 DIAGNOSIS — F0390 Unspecified dementia without behavioral disturbance: Secondary | ICD-10-CM | POA: Diagnosis not present

## 2018-05-29 DIAGNOSIS — F0390 Unspecified dementia without behavioral disturbance: Secondary | ICD-10-CM | POA: Diagnosis not present

## 2018-05-29 DIAGNOSIS — R3981 Functional urinary incontinence: Secondary | ICD-10-CM | POA: Diagnosis not present

## 2018-05-29 DIAGNOSIS — R2689 Other abnormalities of gait and mobility: Secondary | ICD-10-CM | POA: Diagnosis not present

## 2018-05-29 DIAGNOSIS — Z87440 Personal history of urinary (tract) infections: Secondary | ICD-10-CM | POA: Diagnosis not present

## 2018-05-31 DIAGNOSIS — R3981 Functional urinary incontinence: Secondary | ICD-10-CM | POA: Diagnosis not present

## 2018-05-31 DIAGNOSIS — Z87440 Personal history of urinary (tract) infections: Secondary | ICD-10-CM | POA: Diagnosis not present

## 2018-06-05 DIAGNOSIS — R2689 Other abnormalities of gait and mobility: Secondary | ICD-10-CM | POA: Diagnosis not present

## 2018-06-05 DIAGNOSIS — Z87440 Personal history of urinary (tract) infections: Secondary | ICD-10-CM | POA: Diagnosis not present

## 2018-06-05 DIAGNOSIS — R3981 Functional urinary incontinence: Secondary | ICD-10-CM | POA: Diagnosis not present

## 2018-06-05 DIAGNOSIS — F0390 Unspecified dementia without behavioral disturbance: Secondary | ICD-10-CM | POA: Diagnosis not present

## 2018-06-07 DIAGNOSIS — N390 Urinary tract infection, site not specified: Secondary | ICD-10-CM | POA: Diagnosis not present

## 2018-06-09 DIAGNOSIS — R2689 Other abnormalities of gait and mobility: Secondary | ICD-10-CM | POA: Diagnosis not present

## 2018-06-09 DIAGNOSIS — R3981 Functional urinary incontinence: Secondary | ICD-10-CM | POA: Diagnosis not present

## 2018-06-09 DIAGNOSIS — F0390 Unspecified dementia without behavioral disturbance: Secondary | ICD-10-CM | POA: Diagnosis not present

## 2018-06-09 DIAGNOSIS — Z87440 Personal history of urinary (tract) infections: Secondary | ICD-10-CM | POA: Diagnosis not present

## 2018-06-12 DIAGNOSIS — R296 Repeated falls: Secondary | ICD-10-CM | POA: Diagnosis not present

## 2018-06-12 DIAGNOSIS — R269 Unspecified abnormalities of gait and mobility: Secondary | ICD-10-CM | POA: Diagnosis not present

## 2018-06-12 DIAGNOSIS — F0150 Vascular dementia without behavioral disturbance: Secondary | ICD-10-CM | POA: Diagnosis not present

## 2018-06-12 DIAGNOSIS — R3981 Functional urinary incontinence: Secondary | ICD-10-CM | POA: Diagnosis not present

## 2018-06-12 DIAGNOSIS — F0390 Unspecified dementia without behavioral disturbance: Secondary | ICD-10-CM | POA: Diagnosis not present

## 2018-06-12 DIAGNOSIS — Z87440 Personal history of urinary (tract) infections: Secondary | ICD-10-CM | POA: Diagnosis not present

## 2018-06-12 DIAGNOSIS — R2689 Other abnormalities of gait and mobility: Secondary | ICD-10-CM | POA: Diagnosis not present

## 2018-06-15 DIAGNOSIS — R2689 Other abnormalities of gait and mobility: Secondary | ICD-10-CM | POA: Diagnosis not present

## 2018-06-15 DIAGNOSIS — R3981 Functional urinary incontinence: Secondary | ICD-10-CM | POA: Diagnosis not present

## 2018-06-15 DIAGNOSIS — F0390 Unspecified dementia without behavioral disturbance: Secondary | ICD-10-CM | POA: Diagnosis not present

## 2018-06-15 DIAGNOSIS — Z87440 Personal history of urinary (tract) infections: Secondary | ICD-10-CM | POA: Diagnosis not present

## 2018-06-16 DIAGNOSIS — R2689 Other abnormalities of gait and mobility: Secondary | ICD-10-CM | POA: Diagnosis not present

## 2018-06-16 DIAGNOSIS — Z87440 Personal history of urinary (tract) infections: Secondary | ICD-10-CM | POA: Diagnosis not present

## 2018-06-16 DIAGNOSIS — F0390 Unspecified dementia without behavioral disturbance: Secondary | ICD-10-CM | POA: Diagnosis not present

## 2018-06-16 DIAGNOSIS — R3981 Functional urinary incontinence: Secondary | ICD-10-CM | POA: Diagnosis not present

## 2018-06-19 DIAGNOSIS — R3981 Functional urinary incontinence: Secondary | ICD-10-CM | POA: Diagnosis not present

## 2018-06-19 DIAGNOSIS — Z87440 Personal history of urinary (tract) infections: Secondary | ICD-10-CM | POA: Diagnosis not present

## 2018-06-19 DIAGNOSIS — F0390 Unspecified dementia without behavioral disturbance: Secondary | ICD-10-CM | POA: Diagnosis not present

## 2018-06-19 DIAGNOSIS — R2689 Other abnormalities of gait and mobility: Secondary | ICD-10-CM | POA: Diagnosis not present

## 2018-06-20 DIAGNOSIS — R3981 Functional urinary incontinence: Secondary | ICD-10-CM | POA: Diagnosis not present

## 2018-06-20 DIAGNOSIS — R2689 Other abnormalities of gait and mobility: Secondary | ICD-10-CM | POA: Diagnosis not present

## 2018-06-20 DIAGNOSIS — Z87440 Personal history of urinary (tract) infections: Secondary | ICD-10-CM | POA: Diagnosis not present

## 2018-06-20 DIAGNOSIS — F0390 Unspecified dementia without behavioral disturbance: Secondary | ICD-10-CM | POA: Diagnosis not present

## 2018-06-21 DIAGNOSIS — R2689 Other abnormalities of gait and mobility: Secondary | ICD-10-CM | POA: Diagnosis not present

## 2018-06-21 DIAGNOSIS — R3981 Functional urinary incontinence: Secondary | ICD-10-CM | POA: Diagnosis not present

## 2018-06-21 DIAGNOSIS — F0390 Unspecified dementia without behavioral disturbance: Secondary | ICD-10-CM | POA: Diagnosis not present

## 2018-06-21 DIAGNOSIS — Z87440 Personal history of urinary (tract) infections: Secondary | ICD-10-CM | POA: Diagnosis not present

## 2018-06-23 DIAGNOSIS — N390 Urinary tract infection, site not specified: Secondary | ICD-10-CM | POA: Diagnosis not present

## 2018-06-23 DIAGNOSIS — R2689 Other abnormalities of gait and mobility: Secondary | ICD-10-CM | POA: Diagnosis not present

## 2018-06-23 DIAGNOSIS — R3981 Functional urinary incontinence: Secondary | ICD-10-CM | POA: Diagnosis not present

## 2018-06-23 DIAGNOSIS — Z87440 Personal history of urinary (tract) infections: Secondary | ICD-10-CM | POA: Diagnosis not present

## 2018-06-23 DIAGNOSIS — F0390 Unspecified dementia without behavioral disturbance: Secondary | ICD-10-CM | POA: Diagnosis not present

## 2018-06-26 DIAGNOSIS — R3981 Functional urinary incontinence: Secondary | ICD-10-CM | POA: Diagnosis not present

## 2018-06-26 DIAGNOSIS — R2689 Other abnormalities of gait and mobility: Secondary | ICD-10-CM | POA: Diagnosis not present

## 2018-06-26 DIAGNOSIS — F0390 Unspecified dementia without behavioral disturbance: Secondary | ICD-10-CM | POA: Diagnosis not present

## 2018-06-26 DIAGNOSIS — N390 Urinary tract infection, site not specified: Secondary | ICD-10-CM | POA: Diagnosis not present

## 2018-06-26 DIAGNOSIS — Z87440 Personal history of urinary (tract) infections: Secondary | ICD-10-CM | POA: Diagnosis not present

## 2018-06-28 DIAGNOSIS — R3981 Functional urinary incontinence: Secondary | ICD-10-CM | POA: Diagnosis not present

## 2018-06-28 DIAGNOSIS — Z87440 Personal history of urinary (tract) infections: Secondary | ICD-10-CM | POA: Diagnosis not present

## 2018-06-28 DIAGNOSIS — R2689 Other abnormalities of gait and mobility: Secondary | ICD-10-CM | POA: Diagnosis not present

## 2018-06-28 DIAGNOSIS — F0390 Unspecified dementia without behavioral disturbance: Secondary | ICD-10-CM | POA: Diagnosis not present

## 2018-06-30 DIAGNOSIS — R2689 Other abnormalities of gait and mobility: Secondary | ICD-10-CM | POA: Diagnosis not present

## 2018-06-30 DIAGNOSIS — Z87440 Personal history of urinary (tract) infections: Secondary | ICD-10-CM | POA: Diagnosis not present

## 2018-06-30 DIAGNOSIS — N390 Urinary tract infection, site not specified: Secondary | ICD-10-CM | POA: Diagnosis not present

## 2018-06-30 DIAGNOSIS — F0390 Unspecified dementia without behavioral disturbance: Secondary | ICD-10-CM | POA: Diagnosis not present

## 2018-06-30 DIAGNOSIS — R3981 Functional urinary incontinence: Secondary | ICD-10-CM | POA: Diagnosis not present

## 2018-07-03 DIAGNOSIS — F0390 Unspecified dementia without behavioral disturbance: Secondary | ICD-10-CM | POA: Diagnosis not present

## 2018-07-03 DIAGNOSIS — R3981 Functional urinary incontinence: Secondary | ICD-10-CM | POA: Diagnosis not present

## 2018-07-03 DIAGNOSIS — Z87440 Personal history of urinary (tract) infections: Secondary | ICD-10-CM | POA: Diagnosis not present

## 2018-07-03 DIAGNOSIS — R2689 Other abnormalities of gait and mobility: Secondary | ICD-10-CM | POA: Diagnosis not present

## 2018-07-06 DIAGNOSIS — B351 Tinea unguium: Secondary | ICD-10-CM | POA: Diagnosis not present

## 2018-07-06 DIAGNOSIS — M79675 Pain in left toe(s): Secondary | ICD-10-CM | POA: Diagnosis not present

## 2018-07-06 DIAGNOSIS — M79674 Pain in right toe(s): Secondary | ICD-10-CM | POA: Diagnosis not present

## 2018-07-10 DIAGNOSIS — R3981 Functional urinary incontinence: Secondary | ICD-10-CM | POA: Diagnosis not present

## 2018-07-10 DIAGNOSIS — Z87440 Personal history of urinary (tract) infections: Secondary | ICD-10-CM | POA: Diagnosis not present

## 2018-07-10 DIAGNOSIS — R2689 Other abnormalities of gait and mobility: Secondary | ICD-10-CM | POA: Diagnosis not present

## 2018-07-10 DIAGNOSIS — F0390 Unspecified dementia without behavioral disturbance: Secondary | ICD-10-CM | POA: Diagnosis not present

## 2018-07-18 DIAGNOSIS — N390 Urinary tract infection, site not specified: Secondary | ICD-10-CM | POA: Diagnosis not present

## 2018-07-18 DIAGNOSIS — R4182 Altered mental status, unspecified: Secondary | ICD-10-CM | POA: Diagnosis not present

## 2018-07-20 DIAGNOSIS — F0390 Unspecified dementia without behavioral disturbance: Secondary | ICD-10-CM | POA: Diagnosis not present

## 2018-07-20 DIAGNOSIS — R3981 Functional urinary incontinence: Secondary | ICD-10-CM | POA: Diagnosis not present

## 2018-07-20 DIAGNOSIS — Z87440 Personal history of urinary (tract) infections: Secondary | ICD-10-CM | POA: Diagnosis not present

## 2018-07-20 DIAGNOSIS — R2689 Other abnormalities of gait and mobility: Secondary | ICD-10-CM | POA: Diagnosis not present

## 2018-07-24 DIAGNOSIS — F0390 Unspecified dementia without behavioral disturbance: Secondary | ICD-10-CM | POA: Diagnosis not present

## 2018-07-24 DIAGNOSIS — R2689 Other abnormalities of gait and mobility: Secondary | ICD-10-CM | POA: Diagnosis not present

## 2018-07-24 DIAGNOSIS — R3981 Functional urinary incontinence: Secondary | ICD-10-CM | POA: Diagnosis not present

## 2018-07-24 DIAGNOSIS — Z87440 Personal history of urinary (tract) infections: Secondary | ICD-10-CM | POA: Diagnosis not present

## 2018-07-28 DIAGNOSIS — Z87440 Personal history of urinary (tract) infections: Secondary | ICD-10-CM | POA: Diagnosis not present

## 2018-07-28 DIAGNOSIS — R2689 Other abnormalities of gait and mobility: Secondary | ICD-10-CM | POA: Diagnosis not present

## 2018-07-28 DIAGNOSIS — R3981 Functional urinary incontinence: Secondary | ICD-10-CM | POA: Diagnosis not present

## 2018-07-28 DIAGNOSIS — F0390 Unspecified dementia without behavioral disturbance: Secondary | ICD-10-CM | POA: Diagnosis not present

## 2018-08-01 DIAGNOSIS — F0390 Unspecified dementia without behavioral disturbance: Secondary | ICD-10-CM | POA: Diagnosis not present

## 2018-08-03 DIAGNOSIS — R3981 Functional urinary incontinence: Secondary | ICD-10-CM | POA: Diagnosis not present

## 2018-08-03 DIAGNOSIS — R2689 Other abnormalities of gait and mobility: Secondary | ICD-10-CM | POA: Diagnosis not present

## 2018-08-03 DIAGNOSIS — Z87440 Personal history of urinary (tract) infections: Secondary | ICD-10-CM | POA: Diagnosis not present

## 2018-08-03 DIAGNOSIS — F0390 Unspecified dementia without behavioral disturbance: Secondary | ICD-10-CM | POA: Diagnosis not present

## 2018-08-08 DIAGNOSIS — R2689 Other abnormalities of gait and mobility: Secondary | ICD-10-CM | POA: Diagnosis not present

## 2018-08-08 DIAGNOSIS — F0390 Unspecified dementia without behavioral disturbance: Secondary | ICD-10-CM | POA: Diagnosis not present

## 2018-08-08 DIAGNOSIS — R3981 Functional urinary incontinence: Secondary | ICD-10-CM | POA: Diagnosis not present

## 2018-08-08 DIAGNOSIS — Z87440 Personal history of urinary (tract) infections: Secondary | ICD-10-CM | POA: Diagnosis not present

## 2018-08-14 DIAGNOSIS — Z87440 Personal history of urinary (tract) infections: Secondary | ICD-10-CM | POA: Diagnosis not present

## 2018-08-14 DIAGNOSIS — R3981 Functional urinary incontinence: Secondary | ICD-10-CM | POA: Diagnosis not present

## 2018-08-14 DIAGNOSIS — R2689 Other abnormalities of gait and mobility: Secondary | ICD-10-CM | POA: Diagnosis not present

## 2018-08-14 DIAGNOSIS — F0390 Unspecified dementia without behavioral disturbance: Secondary | ICD-10-CM | POA: Diagnosis not present

## 2018-08-22 DIAGNOSIS — F0390 Unspecified dementia without behavioral disturbance: Secondary | ICD-10-CM | POA: Diagnosis not present

## 2018-08-22 DIAGNOSIS — R2689 Other abnormalities of gait and mobility: Secondary | ICD-10-CM | POA: Diagnosis not present

## 2018-08-22 DIAGNOSIS — Z87440 Personal history of urinary (tract) infections: Secondary | ICD-10-CM | POA: Diagnosis not present

## 2018-08-22 DIAGNOSIS — R3981 Functional urinary incontinence: Secondary | ICD-10-CM | POA: Diagnosis not present

## 2018-09-07 DIAGNOSIS — M79674 Pain in right toe(s): Secondary | ICD-10-CM | POA: Diagnosis not present

## 2018-09-07 DIAGNOSIS — B351 Tinea unguium: Secondary | ICD-10-CM | POA: Diagnosis not present

## 2018-09-07 DIAGNOSIS — M79675 Pain in left toe(s): Secondary | ICD-10-CM | POA: Diagnosis not present

## 2018-09-08 DIAGNOSIS — H25812 Combined forms of age-related cataract, left eye: Secondary | ICD-10-CM | POA: Diagnosis not present

## 2018-10-05 DIAGNOSIS — R32 Unspecified urinary incontinence: Secondary | ICD-10-CM | POA: Diagnosis not present

## 2018-10-05 DIAGNOSIS — R2681 Unsteadiness on feet: Secondary | ICD-10-CM | POA: Diagnosis not present

## 2018-10-05 DIAGNOSIS — E43 Unspecified severe protein-calorie malnutrition: Secondary | ICD-10-CM | POA: Diagnosis not present

## 2018-10-05 DIAGNOSIS — F0390 Unspecified dementia without behavioral disturbance: Secondary | ICD-10-CM | POA: Diagnosis not present

## 2018-10-18 ENCOUNTER — Other Ambulatory Visit (HOSPITAL_COMMUNITY)
Admission: RE | Admit: 2018-10-18 | Discharge: 2018-10-18 | Disposition: A | Discharge status: Home / Self Care | Payer: Medicare Other | Source: Ambulatory Visit | Attending: Urology | Admitting: Urology

## 2018-10-18 ENCOUNTER — Ambulatory Visit (INDEPENDENT_AMBULATORY_CARE_PROVIDER_SITE_OTHER): Payer: Medicare Other | Admitting: Urology

## 2018-10-18 DIAGNOSIS — N3021 Other chronic cystitis with hematuria: Secondary | ICD-10-CM | POA: Diagnosis not present

## 2018-10-18 DIAGNOSIS — N3020 Other chronic cystitis without hematuria: Secondary | ICD-10-CM | POA: Diagnosis not present

## 2018-10-20 LAB — URINE CULTURE: Culture: >=100000 — AB

## 2018-10-31 ENCOUNTER — Emergency Department (HOSPITAL_COMMUNITY)
Admission: EM | Admit: 2018-10-31 | Discharge: 2018-10-31 | Disposition: A | Discharge status: Other Acute Inpatient Hospital | Payer: Medicare Other

## 2018-10-31 ENCOUNTER — Other Ambulatory Visit (HOSPITAL_COMMUNITY): Payer: Self-pay | Admitting: Internal Medicine

## 2018-10-31 ENCOUNTER — Other Ambulatory Visit: Payer: Self-pay

## 2018-10-31 ENCOUNTER — Ambulatory Visit (HOSPITAL_COMMUNITY)
Admission: RE | Admit: 2018-10-31 | Discharge: 2018-10-31 | Disposition: A | Discharge status: Home / Self Care | Payer: Medicare Other | Source: Ambulatory Visit | Attending: Internal Medicine | Admitting: Internal Medicine

## 2018-10-31 DIAGNOSIS — M25551 Pain in right hip: Secondary | ICD-10-CM | POA: Diagnosis not present

## 2018-10-31 NOTE — ED Notes (Signed)
Pt has orders for outpt radiology.

## 2018-11-05 DIAGNOSIS — K219 Gastro-esophageal reflux disease without esophagitis: Secondary | ICD-10-CM | POA: Diagnosis not present

## 2018-11-05 DIAGNOSIS — D649 Anemia, unspecified: Secondary | ICD-10-CM | POA: Diagnosis not present

## 2018-11-21 DIAGNOSIS — M79674 Pain in right toe(s): Secondary | ICD-10-CM | POA: Diagnosis not present

## 2018-11-21 DIAGNOSIS — M79675 Pain in left toe(s): Secondary | ICD-10-CM | POA: Diagnosis not present

## 2018-11-21 DIAGNOSIS — B351 Tinea unguium: Secondary | ICD-10-CM | POA: Diagnosis not present

## 2018-11-24 DIAGNOSIS — H2512 Age-related nuclear cataract, left eye: Secondary | ICD-10-CM | POA: Diagnosis not present

## 2018-11-24 DIAGNOSIS — H353131 Nonexudative age-related macular degeneration, bilateral, early dry stage: Secondary | ICD-10-CM | POA: Diagnosis not present

## 2018-11-24 DIAGNOSIS — Z961 Presence of intraocular lens: Secondary | ICD-10-CM | POA: Diagnosis not present

## 2018-11-27 DIAGNOSIS — F0390 Unspecified dementia without behavioral disturbance: Secondary | ICD-10-CM | POA: Diagnosis not present

## 2018-11-27 DIAGNOSIS — K219 Gastro-esophageal reflux disease without esophagitis: Secondary | ICD-10-CM | POA: Diagnosis not present

## 2018-11-27 DIAGNOSIS — Z23 Encounter for immunization: Secondary | ICD-10-CM | POA: Diagnosis not present

## 2019-01-17 ENCOUNTER — Other Ambulatory Visit (HOSPITAL_COMMUNITY)
Admission: RE | Admit: 2019-01-17 | Discharge: 2019-01-17 | Disposition: A | Discharge status: Home / Self Care | Payer: Medicare Other | Source: Ambulatory Visit | Attending: Urology | Admitting: Urology

## 2019-01-17 ENCOUNTER — Ambulatory Visit (INDEPENDENT_AMBULATORY_CARE_PROVIDER_SITE_OTHER): Payer: Medicare Other | Admitting: Urology

## 2019-01-17 DIAGNOSIS — N3020 Other chronic cystitis without hematuria: Secondary | ICD-10-CM | POA: Insufficient documentation

## 2019-01-19 LAB — URINE CULTURE

## 2019-01-23 DIAGNOSIS — M79675 Pain in left toe(s): Secondary | ICD-10-CM | POA: Diagnosis not present

## 2019-01-23 DIAGNOSIS — B351 Tinea unguium: Secondary | ICD-10-CM | POA: Diagnosis not present

## 2019-01-23 DIAGNOSIS — M79674 Pain in right toe(s): Secondary | ICD-10-CM | POA: Diagnosis not present

## 2019-01-25 DIAGNOSIS — Z20828 Contact with and (suspected) exposure to other viral communicable diseases: Secondary | ICD-10-CM | POA: Diagnosis not present

## 2019-01-25 DIAGNOSIS — U071 COVID-19: Secondary | ICD-10-CM | POA: Diagnosis not present

## 2019-01-29 DIAGNOSIS — Z20828 Contact with and (suspected) exposure to other viral communicable diseases: Secondary | ICD-10-CM | POA: Diagnosis not present

## 2019-01-29 DIAGNOSIS — U071 COVID-19: Secondary | ICD-10-CM | POA: Diagnosis not present

## 2019-02-08 ENCOUNTER — Encounter (HOSPITAL_COMMUNITY): Payer: Self-pay

## 2019-02-08 ENCOUNTER — Other Ambulatory Visit: Payer: Self-pay

## 2019-02-08 ENCOUNTER — Emergency Department (HOSPITAL_COMMUNITY): Payer: Medicare Other

## 2019-02-08 ENCOUNTER — Emergency Department (HOSPITAL_COMMUNITY)
Admission: EM | Admit: 2019-02-08 | Discharge: 2019-02-08 | Disposition: A | Discharge status: Skilled Nursing Facility | Payer: Medicare Other | Attending: Emergency Medicine | Admitting: Emergency Medicine

## 2019-02-08 DIAGNOSIS — U071 COVID-19: Secondary | ICD-10-CM | POA: Insufficient documentation

## 2019-02-08 DIAGNOSIS — F0390 Unspecified dementia without behavioral disturbance: Secondary | ICD-10-CM | POA: Diagnosis not present

## 2019-02-08 DIAGNOSIS — Z79899 Other long term (current) drug therapy: Secondary | ICD-10-CM | POA: Insufficient documentation

## 2019-02-08 DIAGNOSIS — R05 Cough: Secondary | ICD-10-CM | POA: Diagnosis not present

## 2019-02-08 DIAGNOSIS — Z7982 Long term (current) use of aspirin: Secondary | ICD-10-CM | POA: Diagnosis not present

## 2019-02-08 MED ORDER — ALBUTEROL SULFATE HFA 108 (90 BASE) MCG/ACT IN AERS: 1.0000 | INHALATION_SPRAY | Freq: Four times a day (QID) | RESPIRATORY_TRACT | 0 refills | Status: DC | PRN

## 2019-02-08 NOTE — ED Triage Notes (Signed)
Pt brought to ED via [place] from [facility] for cough. Per EMS, [place] reports cough started today, denies fever, or SOB. Pt +Covid.

## 2019-02-08 NOTE — ED Notes (Signed)
Report called to [facility], spoke with [place].

## 2019-02-08 NOTE — Discharge Instructions (Signed)
Follow-up with your doctor as needed.  Use albuterol inhaler for shortness of breath

## 2019-02-08 NOTE — ED Provider Notes (Signed)
[facility] EMERGENCY DEPARTMENT Provider Note   CSN: [id number] Arrival date & time: 02/08/19  1455     History Chief Complaint  Patient presents with  . Cough    Judy Stevenson is a 85 y.o. female.  Patient with mild cough.  Patient also has positive Covid.  Patient was sent from the nursing home for evaluation  The history is provided by the nursing home and medical records.  Cough Cough characteristics:  Dry Sputum characteristics:  Nondescript Severity:  Mild Onset quality:  Sudden Timing:  Constant Progression:  Waxing and waning Chronicity:  New Smoker: no   Context: not animal exposure        Past Medical History:  Diagnosis Date  . Dementia ([place])   . GERD (gastroesophageal reflux disease)     There are no problems to display for this patient.   Past Surgical History:  Procedure Laterality Date  . CATARACT EXTRACTION W/PHACO Right 03/04/2015   Procedure: CATARACT EXTRACTION PHACO AND INTRAOCULAR LENS PLACEMENT (IOC);  Surgeon: Rutherford Guys, MD;  Location: [facility];  Service: Ophthalmology;  Laterality: Right;  CDE:45.80     OB History   No obstetric history on file.     No family history on file.  Social History   Tobacco Use  . Smoking status: Never Smoker  . Smokeless tobacco: Never Used  Substance Use Topics  . Alcohol use: No  . Drug use: No    Home Medications Prior to Admission medications   Medication Sig Start Date End Date Taking? Authorizing Provider  acetaminophen 325 MG tablet Take 2 tablets (650 mg total) by mouth every 6 (six) hours as needed for mild pain or moderate pain. 12/15/14   Ward, Delice Bison, DO  albuterol (VENTOLIN HFA) 108 (90 Base) MCG/ACT inhaler Inhale 1-2 puffs into the lungs every 6 (six) hours as needed for wheezing or shortness of breath. 02/08/19   Milton Ferguson, MD  aspirin EC 81 MG tablet Take 81 mg by mouth daily.    [provider]  calcium-vitamin D (OSCAL WITH D) 500-200 MG-UNIT tablet Take 1  tablet by mouth 3 (three) times daily.    [provider]  Dentifrices (BIOTENE DRY MOUTH) GEL Place 1 application onto teeth at bedtime. As directed to brush teeth before bed    [provider]  Multiple Vitamin (DAILY VITE) TABS Take 1 tablet by mouth 2 (two) times daily.    [provider]  MYRBETRIQ 25 MG TB24 tablet Take 25 mg by mouth daily. 02/02/19   [provider]  nitrofurantoin (MACRODANTIN) 50 MG capsule Take 50 mg by mouth daily. 02/02/19   [provider]  oxybutynin (DITROPAN) 5 MG tablet Take 5 mg by mouth 2 (two) times daily.    [provider]  pantoprazole (PROTONIX) 40 MG tablet Take 40 mg by mouth daily.    [provider]  sennosides-docusate sodium (SENOKOT-S) 8.6-50 MG tablet Take 1 tablet by mouth at bedtime.    [provider]  silver sulfADIAZINE (SILVADENE) 1 % cream Apply 1 application topically 2 (two) times daily as needed (to affected areas of the back).    [provider]  sulfamethoxazole-trimethoprim (BACTRIM,SEPTRA) 400-80 MG tablet Take 1 tablet by mouth 2 (two) times daily. 03/06/18   [provider]  trimethoprim (TRIMPEX) 100 MG tablet Take 100 mg by mouth daily. 02/02/19   [provider]    Allergies    Patient has no known allergies.  Review of  Systems   Review of Systems  Unable to perform ROS: Dementia  Respiratory: Positive for cough.     Physical Exam Updated Vital Signs BP 122/73   Pulse 81   Temp 98.2 F (36.8 C) (Oral)   Resp (!) 36   Ht 5\' 1"  (1.549 m)   Wt 54.4 kg   SpO2 98%   BMI 22.67 kg/m   Physical Exam Vitals and nursing note reviewed.  Constitutional:      Appearance: She is well-developed.  HENT:     Head: Normocephalic.     Nose: Nose normal.  Eyes:     General: No scleral icterus.    Conjunctiva/sclera: Conjunctivae normal.  Neck:     Thyroid: No thyromegaly.  Cardiovascular:     Rate and Rhythm: Normal rate and  regular rhythm.     Heart sounds: No murmur. No friction rub. No gallop.   Pulmonary:     Breath sounds: No stridor. No wheezing or rales.  Chest:     Chest wall: No tenderness.  Abdominal:     General: There is no distension.     Tenderness: There is no abdominal tenderness. There is no rebound.  Musculoskeletal:        General: Normal range of motion.     Cervical back: Neck supple.  Lymphadenopathy:     Cervical: No cervical adenopathy.  Skin:    Findings: No erythema or rash.  Neurological:     Motor: No abnormal muscle tone.     Coordination: Coordination normal.     Comments: Patient oriented to person only  Psychiatric:        Behavior: Behavior normal.     ED Results / Procedures / Treatments   Labs (all labs ordered are listed, but only abnormal results are displayed) Labs Reviewed - No data to display  EKG None  Radiology DG Chest Portable 1 View  Result Date: 02/08/2019 CLINICAL DATA:  Cough, COVID-19 positive EXAM: PORTABLE CHEST 1 VIEW COMPARISON:  11/07/2017 FINDINGS: Stable cardiomediastinal contours. Hazy bilateral airspace opacities in a predominantly peripheral distribution, most pronounced within the right mid lung. No appreciable pleural effusion. No pneumothorax. IMPRESSION: Hazy bilateral airspace opacities in a predominantly peripheral distribution, most pronounced within the right mid lung, suspicious for multifocal atypical/viral pneumonia. Electronically Signed   By: Davina Poke M.D.   On: 02/08/2019 15:38    Procedures Procedures (including critical care time)  Medications Ordered in ED Medications - No data to display  ED Course  I have reviewed the triage vital signs and the nursing notes.  Pertinent labs & imaging results that were available during my care of the patient were reviewed by me and considered in my medical decision making (see chart for details). Judy Stevenson was evaluated in Emergency Department on 02/08/2019 for the  symptoms described in the history of present illness. She was evaluated in the context of the global COVID-19 pandemic, which necessitated consideration that the patient might be at risk for infection with the SARS-CoV-2 virus that causes COVID-19. Institutional protocols and algorithms that pertain to the evaluation of patients at risk for COVID-19 are in a state of rapid change based on information released by regulatory bodies including the CDC and federal and state organizations. These policies and algorithms were followed during the patient's care in the    Judy Stevenson was evaluated in Emergency Department on 02/08/2019 for the symptoms described in the history of present illness. She was evaluated in the  context of the global COVID-19 pandemic, which necessitated consideration that the patient might be at risk for infection with the SARS-CoV-2 virus that causes COVID-19. Institutional protocols and algorithms that pertain to the evaluation of patients at risk for COVID-19 are in a state of rapid change based on information released by regulatory bodies including the CDC and federal and state organizations. These policies and algorithms were followed during the patient's care in the ED. Patient not hypoxic.  Patient with Covid.  She will be discharged home on albuterol and follow-up with her doctor MDM Rules/Calculators/A&P                       Final Clinical Impression(s) / ED Diagnoses Final diagnoses:  COVID-19    Rx / DC Orders ED Discharge Orders         Ordered    albuterol (VENTOLIN HFA) 108 (90 Base) MCG/ACT inhaler  Every 6 hours PRN     02/08/19 1948           Milton Ferguson, MD 02/08/19 1952

## 2019-02-11 ENCOUNTER — Emergency Department (HOSPITAL_COMMUNITY)
Admission: EM | Admit: 2019-02-11 | Discharge: 2019-02-11 | Disposition: A | Discharge status: Skilled Nursing Facility | Payer: Medicare Other | Attending: Emergency Medicine | Admitting: Emergency Medicine

## 2019-02-11 ENCOUNTER — Emergency Department (HOSPITAL_COMMUNITY): Payer: Medicare Other

## 2019-02-11 ENCOUNTER — Other Ambulatory Visit: Payer: Self-pay

## 2019-02-11 ENCOUNTER — Encounter (HOSPITAL_COMMUNITY): Payer: Self-pay

## 2019-02-11 DIAGNOSIS — U071 COVID-19: Secondary | ICD-10-CM | POA: Insufficient documentation

## 2019-02-11 DIAGNOSIS — Z79899 Other long term (current) drug therapy: Secondary | ICD-10-CM | POA: Insufficient documentation

## 2019-02-11 DIAGNOSIS — F0390 Unspecified dementia without behavioral disturbance: Secondary | ICD-10-CM | POA: Diagnosis not present

## 2019-02-11 DIAGNOSIS — Z7982 Long term (current) use of aspirin: Secondary | ICD-10-CM | POA: Insufficient documentation

## 2019-02-11 DIAGNOSIS — N390 Urinary tract infection, site not specified: Secondary | ICD-10-CM | POA: Diagnosis not present

## 2019-02-11 DIAGNOSIS — R918 Other nonspecific abnormal finding of lung field: Secondary | ICD-10-CM | POA: Diagnosis not present

## 2019-02-11 DIAGNOSIS — R4182 Altered mental status, unspecified: Secondary | ICD-10-CM | POA: Diagnosis present

## 2019-02-11 LAB — URINALYSIS, ROUTINE W REFLEX MICROSCOPIC
Bilirubin Urine: NEGATIVE
Glucose, UA: NEGATIVE mg/dL
Ketones, ur: NEGATIVE mg/dL
Leukocytes,Ua: NEGATIVE
Nitrite: NEGATIVE
Protein, ur: 30 mg/dL — AB
Specific Gravity, Urine: 1.011 (ref 1.005–1.030)
WBC, UA: >50 WBC/hpf — ABNORMAL HIGH (ref 0–5)
pH: 8 (ref 5.0–8.0)

## 2019-02-11 MED ORDER — CEPHALEXIN 500 MG PO CAPS: 500.0000 mg | ORAL_CAPSULE | Freq: Three times a day (TID) | ORAL | 0 refills | Status: DC

## 2019-02-11 MED ORDER — SODIUM CHLORIDE 0.9 % IV BOLUS
500.0000 mL | Freq: Once | INTRAVENOUS | Status: AC
  Administered 2019-02-11: 20:00:00 500 mL via INTRAVENOUS

## 2019-02-11 MED ORDER — SODIUM CHLORIDE 0.9 % IV SOLN
1.0000 g | Freq: Once | INTRAVENOUS | Status: AC
  Administered 2019-02-11: 1 g via INTRAVENOUS
  Filled 2019-02-11: qty 10

## 2019-02-11 MED ORDER — SODIUM CHLORIDE 0.9 % IV BOLUS
500.0000 mL | Freq: Once | INTRAVENOUS | Status: AC
  Administered 2019-02-11: 500 mL via INTRAVENOUS

## 2019-02-11 NOTE — Discharge Instructions (Addendum)
Judy Stevenson has evidence of a urinary tract infection.  She was given 1 g of Rocephin intravenously in the emergency department.  Urine culture obtained.  Discharge medication cephalexin 500 mg 3 times a day for 1 week.

## 2019-02-11 NOTE — ED Provider Notes (Signed)
[facility] Provider Note   CSN: [id number] Arrival date & time: 02/11/19  1741     History Chief Complaint  Patient presents with  . covid    Judy Stevenson is a 85 y.o. female.  Level 5 caveat for dementia.  Patient resides at [facility].  "Not acting like herself today".  Nursing notes report patient was unresponsive for several minutes earlier today.  Recent diagnosis of Covid on 02/07/2019.  Glucose 154.  Other vital signs within normal limits.        Past Medical History:  Diagnosis Date  . Dementia ([place])   . GERD (gastroesophageal reflux disease)     There are no problems to display for this patient.   Past Surgical History:  Procedure Laterality Date  . CATARACT EXTRACTION W/PHACO Right 03/04/2015   Procedure: CATARACT EXTRACTION PHACO AND INTRAOCULAR LENS PLACEMENT (IOC);  Surgeon: Rutherford Guys, MD;  Location: [facility];  Service: Ophthalmology;  Laterality: Right;  CDE:45.80     OB History   No obstetric history on file.     No family history on file.  Social History   Tobacco Use  . Smoking status: Never Smoker  . Smokeless tobacco: Never Used  Substance Use Topics  . Alcohol use: No  . Drug use: No    Home Medications Prior to Admission medications   Medication Sig Start Date End Date Taking? Authorizing Provider  acetaminophen 325 MG tablet Take 2 tablets (650 mg total) by mouth every 6 (six) hours as needed for mild pain or moderate pain. 12/15/14  Yes Ward, Cyril Mourning N, DO  albuterol (VENTOLIN HFA) 108 (90 Base) MCG/ACT inhaler Inhale 1-2 puffs into the lungs every 6 (six) hours as needed for wheezing or shortness of breath. 02/08/19  Yes Milton Ferguson, MD  aspirin EC 81 MG tablet Take 81 mg by mouth daily.   Yes [provider]  calcium-vitamin D (OSCAL WITH D) 500-200 MG-UNIT tablet Take 1 tablet by mouth 3 (three) times daily.   Yes [provider]  Dentifrices (BIOTENE DRY MOUTH) GEL Place 1 application  onto teeth at bedtime. As directed to brush teeth before bed   Yes [provider]  Multiple Vitamin (DAILY VITE) TABS Take 1 tablet by mouth daily.    Yes [provider]  MYRBETRIQ 25 MG TB24 tablet Take 25 mg by mouth daily. 02/02/19  Yes [provider]  nitrofurantoin (MACRODANTIN) 50 MG capsule Take 50 mg by mouth every evening.  02/02/19  Yes [provider]  pantoprazole (PROTONIX) 40 MG tablet Take 40 mg by mouth daily.   Yes [provider]  sennosides-docusate sodium (SENOKOT-S) 8.6-50 MG tablet Take 1 tablet by mouth at bedtime.   Yes [provider]  silver sulfADIAZINE (SILVADENE) 1 % cream Apply 1 application topically 2 (two) times daily as needed (to affected areas of the back).   Yes [provider]  sulfamethoxazole-trimethoprim (BACTRIM,SEPTRA) 400-80 MG tablet Take 1 tablet by mouth 2 (two) times a week.  03/06/18  Yes [provider]  trimethoprim (TRIMPEX) 100 MG tablet Take 100 mg by mouth at bedtime.  02/02/19  Yes [provider]  cephALEXin (KEFLEX) 500 MG capsule Take 1 capsule (500 mg total) by mouth 3 (three) times daily. 02/11/19   Nat Christen, MD    Allergies    Patient has no known allergies.  Review of Systems   Review of Systems  Unable to perform ROS: Dementia  Physical Exam Updated Vital Signs BP (!) 99/59   Pulse (!) 111   Temp 99.2 F (37.3 C) (Rectal)   Resp (!) 35   Ht 5\' 1"  (1.549 m)   Wt 54 kg   SpO2 94%   BMI 22.49 kg/m   Physical Exam Vitals and nursing note reviewed.  Constitutional:      Appearance: She is well-developed.     Comments: Demented.  HENT:     Head: Normocephalic and atraumatic.  Eyes:     Conjunctiva/sclera: Conjunctivae normal.  Cardiovascular:     Rate and Rhythm: Normal rate and regular rhythm.  Pulmonary:     Effort: Pulmonary effort is normal.     Breath sounds: Normal breath sounds.  Abdominal:     General: Bowel sounds  are normal.     Palpations: Abdomen is soft.  Musculoskeletal:        General: Normal range of motion.     Cervical back: Neck supple.  Skin:    General: Skin is warm and dry.  Neurological:     General: No focal deficit present.     Comments: No episodes of unresponsiveness in the ED.  Psychiatric:        Behavior: Behavior normal.     ED Results / Procedures / Treatments   Labs (all labs ordered are listed, but only abnormal results are displayed) Labs Reviewed  URINALYSIS, ROUTINE W REFLEX MICROSCOPIC - Abnormal; Notable for the following components:      Result Value   APPearance CLOUDY (*)    Hgb urine dipstick SMALL (*)    Protein, ur 30 (*)    WBC, UA >50 (*)    Bacteria, UA FEW (*)    Non Squamous Epithelial 0-5 (*)    All other components within normal limits  URINE CULTURE  CBC WITH DIFFERENTIAL/PLATELET  BASIC METABOLIC PANEL    EKG EKG Interpretation  Date/Time:  Sunday February 11 2019 17:52:40 EST Ventricular Rate:  109 PR Interval:    QRS Duration: 103 QT Interval:  341 QTC Calculation: 460 R Axis:   -147 Text Interpretation: Sinus tachycardia Right axis deviation Abnormal R-wave progression, early transition Abnormal T, consider ischemia, lateral leads Confirmed by Nat Christen [phone]) on 02/11/2019 7:29:00 PM   Radiology DG Chest Port 1 View  Result Date: 02/11/2019 CLINICAL DATA:  COVID-19 positive.  Weakness. EXAM: PORTABLE CHEST 1 VIEW COMPARISON:  February 08, 2019 FINDINGS: Patchy ill-defined infiltrate in the right lung remains. The cardiomediastinal silhouette is stable. No pneumothorax. No other acute abnormalities. IMPRESSION: Patchy infiltrate, particularly in the right lung, likely due to the patient's COVID-19 status. No significant interval change identified. Electronically Signed   By: Dorise Bullion III M.D   On: 02/11/2019 19:25    Procedures Procedures (including critical care time)  Medications Ordered in ED Medications   sodium chloride 0.9 % bolus 500 mL (0 mLs Intravenous Stopped 02/11/19 2015)  sodium chloride 0.9 % bolus 500 mL (500 mLs Intravenous New Bag/Given 02/11/19 2016)  cefTRIAXone (ROCEPHIN) 1 g in sodium chloride 0.9 % 100 mL IVPB (1 g Intravenous New Bag/Given 02/11/19 2018)    ED Course  I have reviewed the triage vital signs and the nursing notes.  Pertinent labs & imaging results that were available during my care of the patient were reviewed by me and considered in my medical decision making (see chart for details).    MDM Rules/Calculators/A&P  Patient was observed for several hours in the emergency department.  No episodes of unresponsiveness were noted.  She does have a evidence of a urinary tract infection.  Rocephin 1 g IV.  Urine culture.  Will discharge with cephalexin 500 mg 3 times daily. Final Clinical Impression(s) / ED Diagnoses Final diagnoses:  [id number]  Urinary tract infection without hematuria, site unspecified    Rx / DC Orders ED Discharge Orders         Ordered    cephALEXin (KEFLEX) 500 MG capsule  3 times daily     02/11/19 2157           Nat Christen, MD 02/11/19 2206

## 2019-02-11 NOTE — ED Notes (Signed)
Called report to [employer] [employer], [place] with [facility]

## 2019-02-11 NOTE — ED Triage Notes (Signed)
Ems reports pt is a resident at [facility].  Room mate says pt has not been acting like herself all day.  Reports pt had been eating chips ealier today.  When staff took pt her dinner tray approx 36min ago pt sat back in her chair and was unresponsive.  Reports episode lasted 5-25min.  EMS says pt was awake but would not talk.    Reports pt has had frequent episodes of urinary incontinence since diagnosed with covid Dec 16.    EMS says before they brought pt inside, pt became unresponsive again.  CBG 154.  bp 114/70, hr 108, 96% o2 sat on room air and rr 18.    EMs placed 20g iv in left ac.

## 2019-02-11 NOTE — ED Notes (Signed)
Notified by Secretary that patient's heart monitor was alarming. Disorganized rhythm showing on monitor. RN to patient room to check on patient, pt noted to be taking off gown and sitting at the end of the stretcher, trying to get out of bed and shaking. Pt alert, but non-verbal with RN, seems disoriented to place and situation. Pt repositioned in the bed, warm blanket given, and lights dimmed for decreased stimulation and for rest encouragement. Primary RN notified.

## 2019-02-13 LAB — URINE CULTURE

## 2019-02-14 ENCOUNTER — Emergency Department (HOSPITAL_COMMUNITY): Payer: Medicare Other

## 2019-02-14 ENCOUNTER — Emergency Department (HOSPITAL_COMMUNITY)
Admission: EM | Admit: 2019-02-14 | Discharge: 2019-02-14 | Disposition: A | Discharge status: Skilled Nursing Facility | Payer: Medicare Other | Attending: Emergency Medicine | Admitting: Emergency Medicine

## 2019-02-14 ENCOUNTER — Encounter (HOSPITAL_COMMUNITY): Payer: Self-pay | Admitting: Emergency Medicine

## 2019-02-14 ENCOUNTER — Other Ambulatory Visit: Payer: Self-pay

## 2019-02-14 DIAGNOSIS — Y999 Unspecified external cause status: Secondary | ICD-10-CM | POA: Insufficient documentation

## 2019-02-14 DIAGNOSIS — F0390 Unspecified dementia without behavioral disturbance: Secondary | ICD-10-CM | POA: Diagnosis not present

## 2019-02-14 DIAGNOSIS — Z79899 Other long term (current) drug therapy: Secondary | ICD-10-CM | POA: Diagnosis not present

## 2019-02-14 DIAGNOSIS — S0990XA Unspecified injury of head, initial encounter: Secondary | ICD-10-CM | POA: Diagnosis not present

## 2019-02-14 DIAGNOSIS — W19XXXA Unspecified fall, initial encounter: Secondary | ICD-10-CM | POA: Diagnosis not present

## 2019-02-14 DIAGNOSIS — S0181XA Laceration without foreign body of other part of head, initial encounter: Secondary | ICD-10-CM | POA: Diagnosis not present

## 2019-02-14 DIAGNOSIS — Y939 Activity, unspecified: Secondary | ICD-10-CM | POA: Diagnosis not present

## 2019-02-14 DIAGNOSIS — S199XXA Unspecified injury of neck, initial encounter: Secondary | ICD-10-CM | POA: Diagnosis not present

## 2019-02-14 DIAGNOSIS — Y92129 Unspecified place in nursing home as the place of occurrence of the external cause: Secondary | ICD-10-CM | POA: Diagnosis not present

## 2019-02-14 DIAGNOSIS — Z7982 Long term (current) use of aspirin: Secondary | ICD-10-CM | POA: Diagnosis not present

## 2019-02-14 LAB — BASIC METABOLIC PANEL
Anion gap: 10 (ref 5–15)
BUN: 18 mg/dL (ref 8–23)
CO2: 26 mmol/L (ref 22–32)
Calcium: 9 mg/dL (ref 8.9–10.3)
Chloride: 99 mmol/L (ref 98–111)
Creatinine, Ser: 0.98 mg/dL (ref 0.44–1.00)
GFR calc Af Amer: 56 mL/min — ABNORMAL LOW (ref >60)
GFR calc non Af Amer: 49 mL/min — ABNORMAL LOW (ref >60)
Glucose, Bld: 87 mg/dL (ref 70–99)
Potassium: 4.9 mmol/L (ref 3.5–5.1)
Sodium: 135 mmol/L (ref 135–145)

## 2019-02-14 LAB — CK: Total CK: 133 U/L (ref 38–234)

## 2019-02-14 NOTE — Discharge Instructions (Addendum)
Your wound has been sealed with adhesive and should fall away within the next  1-2 weeks.  Do not pick at it.  You may wash gently around the site.  Get rechecked if there are any signs of infection as this heals such as redness, swelling or increased pain.

## 2019-02-14 NOTE — ED Provider Notes (Signed)
This patient is a 85 year old female who had an accidental fall in her room, this was unwitnessed, she does not give me much in the way of information.  When she was found by the staff at the facility she was on the floor with a small laceration to her forehead.  On my exam she has minimal hematoma with a small laceration in the middle of it, minimal bleeding, she appears well without any distress, breathing without any distress or increased work of breathing.  She is known to have Covid.  Primary repair, anticipate discharge  Medical screening examination/treatment/procedure(s) were conducted as a shared visit with non-physician practitioner(s) and myself.  I personally evaluated the patient during the encounter.  Clinical Impression:   Final diagnoses:  Fall, initial encounter  Facial laceration, initial encounter         Noemi Chapel, MD 02/15/19 [phone]

## 2019-02-14 NOTE — ED Notes (Signed)
Called for transport back to [facility]

## 2019-02-14 NOTE — ED Triage Notes (Signed)
Patient from [facility]. States they went in to check on her this morning and found her in floor. Patient has laceration noted to forehead. Denies pain.

## 2019-02-14 NOTE — ED Provider Notes (Signed)
[facility] EMERGENCY DEPARTMENT Provider Note   CSN: [id number] Arrival date & time: 02/14/19  [id number]     History Chief Complaint  Patient presents with  . Fall    Judy STAUDE is a 85 y.o. female with a history of dementia and is known to be Covid +, presenting from her local nursing home after she was found this am lying on the floor of her room with a laceration to her forehead.  She has had reduced activity level since being covid +.  Per ems at bedside, he is familiar with her, generally is up in a wheelchair and fairly active.  She has had reduced response to him this am, answering questions with a 1-2 word answer. No vomiting, no complaint of any pain. She was seen here 3 days ago and was diagnosed with a uti.  She is currently on keflex for this.  Review of chart, urine cx multiple specimens so no sensitivity available.   The history is provided by the patient.       Past Medical History:  Diagnosis Date  . Dementia ([place])   . GERD (gastroesophageal reflux disease)     There are no problems to display for this patient.   Past Surgical History:  Procedure Laterality Date  . CATARACT EXTRACTION W/PHACO Right 03/04/2015   Procedure: CATARACT EXTRACTION PHACO AND INTRAOCULAR LENS PLACEMENT (IOC);  Surgeon: Rutherford Guys, MD;  Location: [facility];  Service: Ophthalmology;  Laterality: Right;  CDE:45.80     OB History   No obstetric history on file.     History reviewed. No pertinent family history.  Social History   Tobacco Use  . Smoking status: Never Smoker  . Smokeless tobacco: Never Used  Substance Use Topics  . Alcohol use: No  . Drug use: No    Home Medications Prior to Admission medications   Medication Sig Start Date End Date Taking? Authorizing Provider  acetaminophen 325 MG tablet Take 2 tablets (650 mg total) by mouth every 6 (six) hours as needed for mild pain or moderate pain. 12/15/14   Ward, Delice Bison, DO  albuterol (VENTOLIN HFA) 108 (90 Base)  MCG/ACT inhaler Inhale 1-2 puffs into the lungs every 6 (six) hours as needed for wheezing or shortness of breath. 02/08/19   Milton Ferguson, MD  aspirin EC 81 MG tablet Take 81 mg by mouth daily.    [provider]  calcium-vitamin D (OSCAL WITH D) 500-200 MG-UNIT tablet Take 1 tablet by mouth 3 (three) times daily.    [provider]  cephALEXin (KEFLEX) 500 MG capsule Take 1 capsule (500 mg total) by mouth 3 (three) times daily. 02/11/19   Nat Christen, MD  Dentifrices (BIOTENE DRY MOUTH) GEL Place 1 application onto teeth at bedtime. As directed to brush teeth before bed    [provider]  Multiple Vitamin (DAILY VITE) TABS Take 1 tablet by mouth daily.     [provider]  MYRBETRIQ 25 MG TB24 tablet Take 25 mg by mouth daily. 02/02/19   [provider]  nitrofurantoin (MACRODANTIN) 50 MG capsule Take 50 mg by mouth every evening.  02/02/19   [provider]  pantoprazole (PROTONIX) 40 MG tablet Take 40 mg by mouth daily.    [provider]  sennosides-docusate sodium (SENOKOT-S) 8.6-50 MG tablet Take 1 tablet by mouth at bedtime.    [provider]  silver sulfADIAZINE (SILVADENE) 1 % cream Apply 1 application topically 2 (two) times daily as  needed (to affected areas of the back).    [provider]  sulfamethoxazole-trimethoprim (BACTRIM,SEPTRA) 400-80 MG tablet Take 1 tablet by mouth 2 (two) times a week.  03/06/18   [provider]  trimethoprim (TRIMPEX) 100 MG tablet Take 100 mg by mouth at bedtime.  02/02/19   [provider]    Allergies    Patient has no known allergies.  Review of Systems   Review of Systems  Unable to perform ROS: Dementia    Physical Exam Updated Vital Signs BP 116/64 (BP Location: Left Arm)   Pulse 92   Resp 20   Ht 5\' 3"  (1.6 m)   Wt 54.4 kg   SpO2 96%   BMI 21.26 kg/m   Physical Exam Vitals and nursing note reviewed.  Constitutional:       Appearance: She is well-developed.     Comments: Responds to some questions. Winced prior to cbg check, so appears to be aware of her surroundings.   HENT:     Head: Normocephalic.     Comments: Laceration right upper forehead. Dried blood surrounding the wound.  No hematoma. Eyes:     Conjunctiva/sclera: Conjunctivae normal.  Cardiovascular:     Rate and Rhythm: Normal rate and regular rhythm.     Heart sounds: Normal heart sounds.  Pulmonary:     Effort: Pulmonary effort is normal.     Breath sounds: Normal breath sounds. No wheezing.  Abdominal:     General: Bowel sounds are normal.     Palpations: Abdomen is soft.     Tenderness: There is no abdominal tenderness.  Musculoskeletal:        General: No swelling or deformity. Normal range of motion.     Cervical back: Normal range of motion.  Skin:    General: Skin is warm and dry.     Findings: Lesion present.  Neurological:     General: No focal deficit present.     Comments: Less responsive than her norm per EMS.  Unable to complete meaningful cranial nerve testing.      ED Results / Procedures / Treatments   Labs (all labs ordered are listed, but only abnormal results are displayed) Labs Reviewed  BASIC METABOLIC PANEL - Abnormal; Notable for the following components:      Result Value   GFR calc non Af Amer 49 (*)    GFR calc Af Amer 56 (*)    All other components within normal limits  CK    EKG None  Radiology CT Head Wo Contrast  Result Date: 02/14/2019 CLINICAL DATA:  Minor head trauma EXAM: CT HEAD WITHOUT CONTRAST TECHNIQUE: Contiguous axial images were obtained from the base of the skull through the vertex without intravenous contrast. COMPARISON:  2019 FINDINGS: Brain: There is no acute intracranial hemorrhage, mass-effect, or edema. Gray-white differentiation is preserved. There is no extra-axial fluid collection. Ventricles and sulci are stable in size and configuration. Patchy hypoattenuation in the  supratentorial white matter is nonspecific but likely reflects stable chronic microvascular ischemic changes. Vascular: There is atherosclerotic calcification at the skull base. Skull: Calvarium is unremarkable.  Chronic nasal bone fractures. Sinuses/Orbits: Mild ethmoid sinus mucosal thickening. Other: Right mastoid and middle ear opacification also present on the prior study. IMPRESSION: No evidence of acute intracranial injury. Stable chronic/nonemergent findings detailed above. Electronically Signed   By: Macy Mis M.D.   On: 02/14/2019 12:25   CT Cervical Spine Wo Contrast  Result Date: 02/14/2019 CLINICAL DATA:  Fall EXAM: CT CERVICAL SPINE WITHOUT CONTRAST TECHNIQUE: Multidetector CT imaging of the cervical spine was performed without intravenous contrast. Multiplanar CT image reconstructions were also generated. COMPARISON:  2019 FINDINGS: Alignment: No significant listhesis. Skull base and vertebrae: No acute fracture. Vertebral body heights are maintained. Soft tissues and spinal canal: No prevertebral fluid or swelling. No visible canal hematoma. Disc levels: Multilevel degenerative changes are present including disc space narrowing, endplate osteophytes, and facet and uncovertebral hypertrophy. There is no high-grade osseous encroachment on the spinal canal. Appearance is similar to the prior study. Upper chest: Biapical scarring. Other: None. IMPRESSION: No acute cervical spine fracture. Electronically Signed   By: Macy Mis M.D.   On: 02/14/2019 12:18    Procedures Procedures (including critical care time)  LACERATION REPAIR Performed by: Evalee Jefferson Authorized by: Evalee Jefferson Consent: Verbal consent obtained. Risks and benefits: risks, benefits and alternatives were discussed Consent given by: patient Patient identity confirmed: provided demographic data Prepped and Draped in normal sterile fashion Wound explored  Laceration Location: right forehead near  hairline  Laceration Length: 1cm  No Foreign Bodies seen or palpated  Anesthesia: n/a  Local anesthetic: n/a  Anesthetic total: 0 ml  Irrigation method: syringe Amount of cleaning: standard  Skin closure: dermabond  Number of sutures: none  Technique: dermabond  Patient tolerance: Patient tolerated the procedure well with no immediate complications.   Medications Ordered in ED Medications - No data to display  ED Course  I have reviewed the triage vital signs and the nursing notes.  Pertinent labs & imaging results that were available during my care of the patient were reviewed by me and considered in my medical decision making (see chart for details).    MDM Rules/Calculators/A&P                      No c spine or intracranial injury.  No rhabdo.  Minor appearing laceration forehead tx with dermabond.  Prn f/u anticipated. Pt to return to nursing facility. Final Clinical Impression(s) / ED Diagnoses Final diagnoses:  Fall, initial encounter  Facial laceration, initial encounter    Rx / DC Orders ED Discharge Orders    None       Landis Martins 02/14/19 1335    Noemi Chapel, MD 02/15/19 [phone]

## 2019-02-14 NOTE — ED Notes (Signed)
EMS states pt very lethargic upon departure from [facility]. Verified with Almyra Free, [place], that pt was appropriate for discharge. Pt mental status remains unchanged throughout visit at [facility] ED.

## 2019-02-17 ENCOUNTER — Other Ambulatory Visit: Payer: Self-pay

## 2019-02-17 ENCOUNTER — Emergency Department (HOSPITAL_COMMUNITY)
Admission: EM | Admit: 2019-02-17 | Discharge: 2019-02-18 | Disposition: A | Discharge status: Home / Self Care | Payer: Medicare Other | Attending: Emergency Medicine | Admitting: Emergency Medicine

## 2019-02-17 ENCOUNTER — Emergency Department (HOSPITAL_COMMUNITY): Payer: Medicare Other

## 2019-02-17 ENCOUNTER — Encounter (HOSPITAL_COMMUNITY): Payer: Self-pay | Admitting: Emergency Medicine

## 2019-02-17 DIAGNOSIS — Z79899 Other long term (current) drug therapy: Secondary | ICD-10-CM | POA: Diagnosis not present

## 2019-02-17 DIAGNOSIS — S79912A Unspecified injury of left hip, initial encounter: Secondary | ICD-10-CM | POA: Diagnosis not present

## 2019-02-17 DIAGNOSIS — S79911A Unspecified injury of right hip, initial encounter: Secondary | ICD-10-CM | POA: Diagnosis not present

## 2019-02-17 DIAGNOSIS — U071 COVID-19: Secondary | ICD-10-CM | POA: Diagnosis not present

## 2019-02-17 DIAGNOSIS — Y92122 Bedroom in nursing home as the place of occurrence of the external cause: Secondary | ICD-10-CM | POA: Insufficient documentation

## 2019-02-17 DIAGNOSIS — S199XXA Unspecified injury of neck, initial encounter: Secondary | ICD-10-CM | POA: Diagnosis not present

## 2019-02-17 DIAGNOSIS — F0390 Unspecified dementia without behavioral disturbance: Secondary | ICD-10-CM | POA: Diagnosis not present

## 2019-02-17 DIAGNOSIS — W19XXXA Unspecified fall, initial encounter: Secondary | ICD-10-CM | POA: Insufficient documentation

## 2019-02-17 DIAGNOSIS — S0181XA Laceration without foreign body of other part of head, initial encounter: Secondary | ICD-10-CM | POA: Diagnosis not present

## 2019-02-17 DIAGNOSIS — Z7982 Long term (current) use of aspirin: Secondary | ICD-10-CM | POA: Insufficient documentation

## 2019-02-17 LAB — CBC WITH DIFFERENTIAL/PLATELET
Abs Immature Granulocytes: 0.05 10*3/uL (ref 0.00–0.07)
Basophils Absolute: 0 10*3/uL (ref 0.0–0.1)
Basophils Relative: 0 %
Eosinophils Absolute: 0.1 10*3/uL (ref 0.0–0.5)
Eosinophils Relative: 1 %
HCT: 37.8 % (ref 36.0–46.0)
Hemoglobin: 12 g/dL (ref 12.0–15.0)
Immature Granulocytes: 1 %
Lymphocytes Relative: 14 %
Lymphs Abs: 1.5 10*3/uL (ref 0.7–4.0)
MCH: 30.8 pg (ref 26.0–34.0)
MCHC: 31.7 g/dL (ref 30.0–36.0)
MCV: 96.9 fL (ref 80.0–100.0)
Monocytes Absolute: 0.9 10*3/uL (ref 0.1–1.0)
Monocytes Relative: 9 %
Neutro Abs: 8 10*3/uL — ABNORMAL HIGH (ref 1.7–7.7)
Neutrophils Relative %: 75 %
Platelets: 463 10*3/uL — ABNORMAL HIGH (ref 150–400)
RBC: 3.9 MIL/uL (ref 3.87–5.11)
RDW: 13.2 % (ref 11.5–15.5)
WBC: 10.6 10*3/uL — ABNORMAL HIGH (ref 4.0–10.5)
nRBC: 0 % (ref 0.0–0.2)

## 2019-02-17 LAB — COMPREHENSIVE METABOLIC PANEL
ALT: 35 U/L (ref 0–44)
AST: 45 U/L — ABNORMAL HIGH (ref 15–41)
Albumin: 3.3 g/dL — ABNORMAL LOW (ref 3.5–5.0)
Alkaline Phosphatase: 90 U/L (ref 38–126)
Anion gap: 6 (ref 5–15)
BUN: 28 mg/dL — ABNORMAL HIGH (ref 8–23)
CO2: 26 mmol/L (ref 22–32)
Calcium: 8.8 mg/dL — ABNORMAL LOW (ref 8.9–10.3)
Chloride: 105 mmol/L (ref 98–111)
Creatinine, Ser: 0.94 mg/dL (ref 0.44–1.00)
GFR calc Af Amer: 59 mL/min — ABNORMAL LOW (ref >60)
GFR calc non Af Amer: 51 mL/min — ABNORMAL LOW (ref >60)
Glucose, Bld: 110 mg/dL — ABNORMAL HIGH (ref 70–99)
Potassium: 4.6 mmol/L (ref 3.5–5.1)
Sodium: 137 mmol/L (ref 135–145)
Total Bilirubin: 0.4 mg/dL (ref 0.3–1.2)
Total Protein: 6.7 g/dL (ref 6.5–8.1)

## 2019-02-17 LAB — URINALYSIS, ROUTINE W REFLEX MICROSCOPIC
Bilirubin Urine: NEGATIVE
Glucose, UA: NEGATIVE mg/dL
Hgb urine dipstick: NEGATIVE
Ketones, ur: NEGATIVE mg/dL
Leukocytes,Ua: NEGATIVE
Nitrite: NEGATIVE
Protein, ur: NEGATIVE mg/dL
Specific Gravity, Urine: 1.018 (ref 1.005–1.030)
pH: 6 (ref 5.0–8.0)

## 2019-02-17 NOTE — ED Triage Notes (Signed)
Pt was brought in from a fall at [facility] they want her to have x rays.

## 2019-02-17 NOTE — ED Provider Notes (Signed)
[facility] EMERGENCY DEPARTMENT Provider Note   CSN: [id number] Arrival date & time: 02/17/19  1810     History Chief Complaint  Patient presents with  . Fall    Judy Stevenson is a 85 y.o. female with a past medical history significant for dementia and GERD who presents to the ED after a fall that occurred right after dinner around 6pm. Spoke to med tech, Building services engineer at [place] to obtain history given patient's history of dementia. Judy Stevenson notes that patient was fed dinner in her room and was laying in bed when she heard her screaming and found her on the floor. Patient is known COVID positive. Judy Stevenson notes her cough has improved over the past few days, but she still remains weak. Per Judy Stevenson, patient did not eat much dinner and would just spit it out. She was seen in the ER on 12/23 for a fall where she sustained a facial laceration with reassuring workup. Judy Stevenson notes that the patient has been more jittery since her last fall on 12/23. Patient is on ASA 81mg  daily. Judy Stevenson notes that patient is a Judy Stevenson of [place] and is full code. Patient normally ambulates with a walker. Patient seen in ED on 12/20 and diagnosed with UTI and is currently on Keflex.   Level 5 caveat for history of dementia Past Medical History:  Diagnosis Date  . Dementia ([place])   . GERD (gastroesophageal reflux disease)     There are no problems to display for this patient.   Past Surgical History:  Procedure Laterality Date  . CATARACT EXTRACTION W/PHACO Right 03/04/2015   Procedure: CATARACT EXTRACTION PHACO AND INTRAOCULAR LENS PLACEMENT (IOC);  Surgeon: Judy Guys, MD;  Location: [facility];  Service: Ophthalmology;  Laterality: Right;  CDE:45.80     OB History   No obstetric history on file.     History reviewed. No pertinent family history.  Social History   Tobacco Use  . Smoking status: Never Smoker  . Smokeless tobacco: Never Used  Substance Use Topics  . Alcohol use:  No  . Drug use: No    Home Medications Prior to Admission medications   Medication Sig Start Date End Date Taking? Authorizing Provider  acetaminophen 325 MG tablet Take 2 tablets (650 mg total) by mouth every 6 (six) hours as needed for mild pain or moderate pain. 12/15/14  Yes Judy Stevenson, Judy Mourning N, DO  albuterol (VENTOLIN HFA) 108 (90 Base) MCG/ACT inhaler Inhale 1-2 puffs into the lungs every 6 (six) hours as needed for wheezing or shortness of breath. 02/08/19  Yes Judy Ferguson, MD  aspirin EC 81 MG tablet Take 81 mg by mouth daily.   Yes [provider]  calcium-vitamin D (OSCAL WITH D) 500-200 MG-UNIT tablet Take 1 tablet by mouth 3 (three) times daily.   Yes [provider]  cephALEXin (KEFLEX) 500 MG capsule Take 1 capsule (500 mg total) by mouth 3 (three) times daily. 02/11/19  Yes Judy Christen, MD  Dentifrices (BIOTENE DRY MOUTH) GEL Place 1 application onto teeth at bedtime. As directed to brush teeth before bed   Yes [provider]  Multiple Vitamin (DAILY VITE) TABS Take 1 tablet by mouth daily.    Yes [provider]  MYRBETRIQ 25 MG TB24 tablet Take 25 mg by mouth daily. 02/02/19  Yes [provider]  pantoprazole (PROTONIX) 40 MG tablet Take 40 mg by mouth daily.   Yes [provider]  sennosides-docusate sodium (SENOKOT-S) 8.6-50  MG tablet Take 1 tablet by mouth at bedtime.   Yes [provider]  silver sulfADIAZINE (SILVADENE) 1 % cream Apply 1 application topically 2 (two) times daily as needed (to affected areas of the back).   Yes [provider]  trimethoprim (TRIMPEX) 100 MG tablet Take 100 mg by mouth at bedtime.  02/02/19  Yes [provider]    Allergies    Patient has no known allergies.  Review of Systems   Review of Systems  Unable to perform ROS: Dementia    Physical Exam Updated Vital Signs BP 116/71   Pulse 81   Temp 97.6 F (36.4 C) (Oral)   Resp (!) 24   Ht 5' (1.524 m)    Wt 54.4 kg   SpO2 97%   BMI 23.42 kg/m   Physical Exam Vitals and nursing note reviewed.  Constitutional:      General: She is not in acute distress.    Appearance: She is not toxic-appearing.     Comments: Opens eyes and looks at me, but no communication  HENT:     Head: Normocephalic.     Comments: Well healing small laceration on forehead Eyes:     Conjunctiva/sclera: Conjunctivae normal.     Pupils: Pupils are equal, round, and reactive to light.  Neck:     Comments: No deformity, crepitus, or step-off Cardiovascular:     Rate and Rhythm: Normal rate and regular rhythm.     Pulses: Normal pulses.     Heart sounds: Normal heart sounds. No murmur. No friction rub. No gallop.   Pulmonary:     Effort: Pulmonary effort is normal.     Breath sounds: Normal breath sounds.  Abdominal:     General: Abdomen is flat. Bowel sounds are normal. There is no distension.     Palpations: Abdomen is soft.     Tenderness: There is no abdominal tenderness. There is no guarding or rebound.  Musculoskeletal:     Cervical back: Neck supple.     Right lower leg: No edema.     Left lower leg: No edema.     Comments: Bilateral hips appear stable with no noticeable tenderness to palpation. Distal sensation and pulses intact bilaterally.   Skin:    General: Skin is warm.  Neurological:     Comments: Unable to assess cranial nerves given patient does not follow commands. Patient able to raise both arms with no weakness present. Unable to assess grip strength.     ED Results / Procedures / Treatments   Labs (all labs ordered are listed, but only abnormal results are displayed) Labs Reviewed  URINALYSIS, ROUTINE W REFLEX MICROSCOPIC - Abnormal; Notable for the following components:      Result Value   APPearance HAZY (*)    All other components within normal limits  COMPREHENSIVE METABOLIC PANEL - Abnormal; Notable for the following components:   Glucose, Bld 110 (*)    BUN 28 (*)     Calcium 8.8 (*)    Albumin 3.3 (*)    AST 45 (*)    GFR calc non Af Amer 51 (*)    GFR calc Af Amer 59 (*)    All other components within normal limits  CBC WITH DIFFERENTIAL/PLATELET - Abnormal; Notable for the following components:   WBC 10.6 (*)    Platelets 463 (*)    Neutro Abs 8.0 (*)    All other components within normal limits  URINE CULTURE  EKG EKG Interpretation  Date/Time:  Saturday February 17 2019 19:50:19 EST Ventricular Rate:  88 PR Interval:    QRS Duration: 102 QT Interval:  382 QTC Calculation: 463 R Axis:   -31 Text Interpretation: Sinus rhythm Prolonged PR interval Left axis deviation Abnormal R-wave progression, early transition No STEMI Confirmed by Nanda Quinton [phone]) on 02/17/2019 9:07:17 PM   Radiology CT Head Wo Contrast  Result Date: 02/17/2019 CLINICAL DATA:  Fall, left forehead laceration EXAM: CT HEAD WITHOUT CONTRAST CT CERVICAL SPINE WITHOUT CONTRAST TECHNIQUE: Multidetector CT imaging of the head and cervical spine was performed following the standard protocol without intravenous contrast. Multiplanar CT image reconstructions of the cervical spine were also generated. COMPARISON:  CT head and cervical spine 02/14/2019 FINDINGS: CT HEAD FINDINGS Brain: Initial image acquisition with motion artifact at the level of the skull base and posterior fossa. Images of the skull base were reacquired. No evidence of acute infarction, hemorrhage, hydrocephalus, extra-axial collection or mass lesion/mass effect. Symmetric prominence of the ventricles, cisterns and sulci compatible with parenchymal volume loss. Patchy areas of white matter hypoattenuation are most compatible with chronic microvascular angiopathy. Vascular: No hyperdense vessel or unexpected calcification. Skull: Mild left frontal and supraorbital scalp swelling and skin irregularity compatible reported site of laceration. No subjacent gas or foreign body. No calvarial fracture or acute visible  first or bone fracture. Remote nasal bone fractures are similar to prior. Sinuses/Orbits: Paranasal sinuses and mastoid air cells are predominantly clear. Left palpebral swelling. No retro septal gas or hemorrhage. Globes appear normal and symmetric. Lenses are orthotopic. Other: Mild bilateral temporomandibular joint osteoarthrosis. CT CERVICAL SPINE FINDINGS Alignment: Preservation of the normal cervical lordosis. Atlantoaxial and craniocervical articulations are maintained. No abnormally widened, perched or jumped facets. No traumatic listhesis. Mild rightward neck flexion. Skull base and vertebrae: No acute fracture. No primary bone lesion or focal pathologic process. Soft tissues and spinal canal: No pre or paravertebral fluid or swelling. No visible canal hematoma. Disc levels: Multilevel intervertebral disc height loss with spondylitic endplate changes. Ligamentum flavum redundancy seen at C3-4 and C4-5. Findings result in no significant spinal canal stenosis. Mild multilevel uncinate spurring and facet hypertrophy with at most mild neural foraminal narrowing at C5-6 and C6-7. Upper chest: Biapical pleuroparenchymal scarring is seen, similar to comparison. No worrisome apical process. Other: Heterogeneous and nodular thyroid gland. Cervical carotid atherosclerosis. IMPRESSION: 1. No acute intracranial abnormality. Atrophy and chronic microvascular angiopathy, similar to prior. 2. Left frontal and supraorbital scalp swelling and skin irregularity compatible with reported site of laceration. No subjacent fracture, gas or foreign body. 3. No acute fracture or subluxation of the cervical spine. Multilevel degenerative disc disease and facet hypertrophy. 4. Heterogeneous and nodular thyroid gland. Decision for further imaging should be based on assessment of patient age and comorbidities. This follows consensus guidelines: Managing Incidental Thyroid Nodules Detected on Imaging: White Paper of the ACR Incidental  Thyroid Findings Committee. J Am Coll Radiol 2015; 12:143-150. and [facility] 3-tiered system for managing ITNs: J Am Coll Radiol. 2015; Feb;12(2): 143-50 Electronically Signed   By: Lovena Le M.D.   On: 02/17/2019 20:43   CT Cervical Spine Wo Contrast  Result Date: 02/17/2019 CLINICAL DATA:  Fall, left forehead laceration EXAM: CT HEAD WITHOUT CONTRAST CT CERVICAL SPINE WITHOUT CONTRAST TECHNIQUE: Multidetector CT imaging of the head and cervical spine was performed following the standard protocol without intravenous contrast. Multiplanar CT image reconstructions of the cervical spine were also generated. COMPARISON:  CT head and cervical spine  02/14/2019 FINDINGS: CT HEAD FINDINGS Brain: Initial image acquisition with motion artifact at the level of the skull base and posterior fossa. Images of the skull base were reacquired. No evidence of acute infarction, hemorrhage, hydrocephalus, extra-axial collection or mass lesion/mass effect. Symmetric prominence of the ventricles, cisterns and sulci compatible with parenchymal volume loss. Patchy areas of white matter hypoattenuation are most compatible with chronic microvascular angiopathy. Vascular: No hyperdense vessel or unexpected calcification. Skull: Mild left frontal and supraorbital scalp swelling and skin irregularity compatible reported site of laceration. No subjacent gas or foreign body. No calvarial fracture or acute visible first or bone fracture. Remote nasal bone fractures are similar to prior. Sinuses/Orbits: Paranasal sinuses and mastoid air cells are predominantly clear. Left palpebral swelling. No retro septal gas or hemorrhage. Globes appear normal and symmetric. Lenses are orthotopic. Other: Mild bilateral temporomandibular joint osteoarthrosis. CT CERVICAL SPINE FINDINGS Alignment: Preservation of the normal cervical lordosis. Atlantoaxial and craniocervical articulations are maintained. No abnormally widened, perched or jumped facets. No  traumatic listhesis. Mild rightward neck flexion. Skull base and vertebrae: No acute fracture. No primary bone lesion or focal pathologic process. Soft tissues and spinal canal: No pre or paravertebral fluid or swelling. No visible canal hematoma. Disc levels: Multilevel intervertebral disc height loss with spondylitic endplate changes. Ligamentum flavum redundancy seen at C3-4 and C4-5. Findings result in no significant spinal canal stenosis. Mild multilevel uncinate spurring and facet hypertrophy with at most mild neural foraminal narrowing at C5-6 and C6-7. Upper chest: Biapical pleuroparenchymal scarring is seen, similar to comparison. No worrisome apical process. Other: Heterogeneous and nodular thyroid gland. Cervical carotid atherosclerosis. IMPRESSION: 1. No acute intracranial abnormality. Atrophy and chronic microvascular angiopathy, similar to prior. 2. Left frontal and supraorbital scalp swelling and skin irregularity compatible with reported site of laceration. No subjacent fracture, gas or foreign body. 3. No acute fracture or subluxation of the cervical spine. Multilevel degenerative disc disease and facet hypertrophy. 4. Heterogeneous and nodular thyroid gland. Decision for further imaging should be based on assessment of patient age and comorbidities. This follows consensus guidelines: Managing Incidental Thyroid Nodules Detected on Imaging: White Paper of the ACR Incidental Thyroid Findings Committee. J Am Coll Radiol 2015; 12:143-150. and [facility] 3-tiered system for managing ITNs: J Am Coll Radiol. 2015; Feb;12(2): 143-50 Electronically Signed   By: Lovena Le M.D.   On: 02/17/2019 20:43   DG Hips Bilat W or Wo Pelvis 3-4 Views  Result Date: 02/18/2019 CLINICAL DATA:  Fall EXAM: DG HIP (WITH OR WITHOUT PELVIS) 3-4V BILAT COMPARISON:  None. FINDINGS: There is no evidence of hip fracture or dislocation. There is no evidence of arthropathy or other focal bone abnormality. IMPRESSION: Negative.  Electronically Signed   By: Ulyses Jarred M.D.   On: 02/18/2019 02:34    Procedures Procedures (including critical care time)  Medications Ordered in ED Medications - No data to display  ED Course  I have reviewed the triage vital signs and the nursing notes.  Pertinent labs & imaging results that were available during my care of the patient were reviewed by me and considered in my medical decision making (see chart for details).  Clinical Course as of Feb 18 236  Sat Feb 17, 2019  2108 Spoke to [place] after hours DSS. Placed a message to receive call back from social worker on call to update on patient's status and confirm code status   [CA]  2131 Confirmed by [facility] DSS that patient is full code.   [CA]  2344 CT scan personally reviewed which demonstrates:  1. No acute intracranial abnormality. Atrophy and chronic microvascular angiopathy, similar to prior. 2. Left frontal and supraorbital scalp swelling and skin irregularity compatible with reported site of laceration. No subjacent fracture, gas or foreign body. 3. No acute fracture or subluxation of the cervical spine. Multilevel degenerative disc disease and facet hypertrophy. 4. Heterogeneous and nodular thyroid gland. Decision for further imaging should be based on assessment of patient age and comorbidities. This follows consensus guidelines: Managing Incidental Thyroid Nodules Detected on Imaging: White Paper of the ACR Incidental Thyroid Findings Committee. J Am Coll Radiol 2015; 12:143-150. and [facility] 3-tiered system for managing ITNs: J Am Coll Radiol. 2015; Feb;12(2): 143-50   [CA]    Clinical Course User Index [CA] Suzy Bouchard, PA-C   MDM Rules/Calculators/A&P                      85 year old female presents [facility] the ED [facility] after rolling out of bed around 6 PM this evening.  Patient unable to give history due to dementia. Patient in no acute distress and non-toxic  appearing. Patient will open eyes to voice, but will not communicate. Unable to assess cranial nerves. Abdomen soft, non-distended, and non-tender. Will obtain routine labs and UA/urine culture because she was diagnosed with UTI a few days prior. CT head and cervical spine ordered to rule out acute intracranial abnormalities and cervical spine fracture given unwitnessed fall. Discussed case with Dr. Laverta Baltimore who evaluated patient at bedside and agrees with assessment and plan.   CBC with mild leukocytosis at 10.6 and elevated platelets at 463, but otherwise unremarkable.  CMP significant for hyperglycemia 110, elevated BUN at 28, elevated AST at 45. EKG personally reviewed which demonstrated sinus rhythm with no signs of ischemia.  UA negative for signs of infection and hematuria.  CT scan personally reviewed which is negative for any acute abnormalities of the brain or C-spine.   Patient handed off to Dr. Gilford Raid at shift change to follow-up on bilateral hip x-ray, reassess patient, and determine disposition. If hip x-rays are normal, patient can be discharged back to [facility].    Final Clinical Impression(s) / ED Diagnoses Final diagnoses:  None    Rx / DC Orders ED Discharge Orders    None       Karie Kirks 02/18/19 [id number]    Margette Fast, MD 02/18/19 1257

## 2019-02-18 DIAGNOSIS — S79912A Unspecified injury of left hip, initial encounter: Secondary | ICD-10-CM | POA: Diagnosis not present

## 2019-02-18 DIAGNOSIS — S79911A Unspecified injury of right hip, initial encounter: Secondary | ICD-10-CM | POA: Diagnosis not present

## 2019-02-18 NOTE — ED Provider Notes (Signed)
Pt signed out pending hip xray.  Xray is negative.    Pt has been here for several hours and vitals have been good.  Pt is stable for d/c.  Return if worse.    Isla Pence, MD 02/18/19 [id number]

## 2019-02-20 DIAGNOSIS — R296 Repeated falls: Secondary | ICD-10-CM | POA: Diagnosis not present

## 2019-02-20 DIAGNOSIS — U071 COVID-19: Secondary | ICD-10-CM | POA: Diagnosis not present

## 2019-02-20 DIAGNOSIS — F0390 Unspecified dementia without behavioral disturbance: Secondary | ICD-10-CM | POA: Diagnosis not present

## 2019-02-20 DIAGNOSIS — M6281 Muscle weakness (generalized): Secondary | ICD-10-CM | POA: Diagnosis not present

## 2019-02-20 LAB — URINE CULTURE: Culture: 50000 — AB

## 2019-02-21 ENCOUNTER — Telehealth: Payer: Self-pay

## 2019-02-21 NOTE — Telephone Encounter (Signed)
Post ED Visit - Positive Culture Follow-up  Culture report reviewed by antimicrobial stewardship pharmacist: [place] Team []  Elenor Quinones, Pharm.D. []  Heide Guile, Pharm.D., BCPS AQ-ID []  Parks Neptune, Pharm.D., BCPS []  Alycia Rossetti, Pharm.D., BCPS []  [place], Pharm.D., BCPS, AAHIVP []  Legrand Como, Pharm.D., BCPS, AAHIVP []  Salome Arnt, PharmD, BCPS []  Johnnette Gourd, PharmD, BCPS []  Hughes Better, PharmD, BCPS []  Leeroy Cha, PharmD []  Laqueta Linden, PharmD, BCPS []  Albertina Parr, PharmD [place] Team []  Leodis Sias, PharmD []  Lindell Spar, PharmD []  Royetta Asal, PharmD []  Graylin Shiver, Rph []  Rema Fendt) Glennon Mac, PharmD []  Arlyn Dunning, PharmD []  Netta Cedars, PharmD []  Dia Sitter, PharmD []  Leone Haven, PharmD []  Gretta Arab, PharmD []  Theodis Shove, PharmD []  Peggyann Juba, PharmD []  Reuel Boom, PharmD   Positive urine culture Treated with Cephalexin, organism sensitive to the same and no further patient follow-up is required at this time.  Genia Del 02/21/2019, 12:26 PM

## 2019-02-25 DIAGNOSIS — M6281 Muscle weakness (generalized): Secondary | ICD-10-CM | POA: Diagnosis not present

## 2019-02-25 DIAGNOSIS — U071 COVID-19: Secondary | ICD-10-CM | POA: Diagnosis not present

## 2019-02-25 DIAGNOSIS — F0390 Unspecified dementia without behavioral disturbance: Secondary | ICD-10-CM | POA: Diagnosis not present

## 2019-02-25 DIAGNOSIS — R296 Repeated falls: Secondary | ICD-10-CM | POA: Diagnosis not present

## 2019-02-26 DIAGNOSIS — U071 COVID-19: Secondary | ICD-10-CM | POA: Diagnosis not present

## 2019-02-26 DIAGNOSIS — F0390 Unspecified dementia without behavioral disturbance: Secondary | ICD-10-CM | POA: Diagnosis not present

## 2019-02-26 DIAGNOSIS — R296 Repeated falls: Secondary | ICD-10-CM | POA: Diagnosis not present

## 2019-02-26 DIAGNOSIS — M6281 Muscle weakness (generalized): Secondary | ICD-10-CM | POA: Diagnosis not present

## 2019-02-27 DIAGNOSIS — R296 Repeated falls: Secondary | ICD-10-CM | POA: Diagnosis not present

## 2019-02-27 DIAGNOSIS — F0390 Unspecified dementia without behavioral disturbance: Secondary | ICD-10-CM | POA: Diagnosis not present

## 2019-02-27 DIAGNOSIS — U071 COVID-19: Secondary | ICD-10-CM | POA: Diagnosis not present

## 2019-02-27 DIAGNOSIS — M6281 Muscle weakness (generalized): Secondary | ICD-10-CM | POA: Diagnosis not present

## 2019-02-27 DIAGNOSIS — E43 Unspecified severe protein-calorie malnutrition: Secondary | ICD-10-CM | POA: Diagnosis not present

## 2019-03-05 DIAGNOSIS — M6281 Muscle weakness (generalized): Secondary | ICD-10-CM | POA: Diagnosis not present

## 2019-03-05 DIAGNOSIS — F0390 Unspecified dementia without behavioral disturbance: Secondary | ICD-10-CM | POA: Diagnosis not present

## 2019-03-05 DIAGNOSIS — U071 COVID-19: Secondary | ICD-10-CM | POA: Diagnosis not present

## 2019-03-05 DIAGNOSIS — R296 Repeated falls: Secondary | ICD-10-CM | POA: Diagnosis not present

## 2019-03-06 DIAGNOSIS — M6281 Muscle weakness (generalized): Secondary | ICD-10-CM | POA: Diagnosis not present

## 2019-03-06 DIAGNOSIS — F0390 Unspecified dementia without behavioral disturbance: Secondary | ICD-10-CM | POA: Diagnosis not present

## 2019-03-06 DIAGNOSIS — R296 Repeated falls: Secondary | ICD-10-CM | POA: Diagnosis not present

## 2019-03-06 DIAGNOSIS — U071 COVID-19: Secondary | ICD-10-CM | POA: Diagnosis not present

## 2019-03-07 DIAGNOSIS — F0390 Unspecified dementia without behavioral disturbance: Secondary | ICD-10-CM | POA: Diagnosis not present

## 2019-03-07 DIAGNOSIS — U071 COVID-19: Secondary | ICD-10-CM | POA: Diagnosis not present

## 2019-03-07 DIAGNOSIS — M6281 Muscle weakness (generalized): Secondary | ICD-10-CM | POA: Diagnosis not present

## 2019-03-07 DIAGNOSIS — R296 Repeated falls: Secondary | ICD-10-CM | POA: Diagnosis not present

## 2019-03-12 DIAGNOSIS — U071 COVID-19: Secondary | ICD-10-CM | POA: Diagnosis not present

## 2019-03-12 DIAGNOSIS — R296 Repeated falls: Secondary | ICD-10-CM | POA: Diagnosis not present

## 2019-03-12 DIAGNOSIS — F0390 Unspecified dementia without behavioral disturbance: Secondary | ICD-10-CM | POA: Diagnosis not present

## 2019-03-12 DIAGNOSIS — M6281 Muscle weakness (generalized): Secondary | ICD-10-CM | POA: Diagnosis not present

## 2019-03-14 DIAGNOSIS — H25812 Combined forms of age-related cataract, left eye: Secondary | ICD-10-CM | POA: Diagnosis not present

## 2019-03-15 DIAGNOSIS — F0390 Unspecified dementia without behavioral disturbance: Secondary | ICD-10-CM | POA: Diagnosis not present

## 2019-03-15 DIAGNOSIS — R296 Repeated falls: Secondary | ICD-10-CM | POA: Diagnosis not present

## 2019-03-15 DIAGNOSIS — M6281 Muscle weakness (generalized): Secondary | ICD-10-CM | POA: Diagnosis not present

## 2019-03-15 DIAGNOSIS — U071 COVID-19: Secondary | ICD-10-CM | POA: Diagnosis not present

## 2019-03-19 DIAGNOSIS — M6281 Muscle weakness (generalized): Secondary | ICD-10-CM | POA: Diagnosis not present

## 2019-03-19 DIAGNOSIS — F0390 Unspecified dementia without behavioral disturbance: Secondary | ICD-10-CM | POA: Diagnosis not present

## 2019-03-19 DIAGNOSIS — U071 COVID-19: Secondary | ICD-10-CM | POA: Diagnosis not present

## 2019-03-19 DIAGNOSIS — R296 Repeated falls: Secondary | ICD-10-CM | POA: Diagnosis not present

## 2019-03-22 DIAGNOSIS — R296 Repeated falls: Secondary | ICD-10-CM | POA: Diagnosis not present

## 2019-03-22 DIAGNOSIS — K219 Gastro-esophageal reflux disease without esophagitis: Secondary | ICD-10-CM | POA: Diagnosis not present

## 2019-03-22 DIAGNOSIS — U071 COVID-19: Secondary | ICD-10-CM | POA: Diagnosis not present

## 2019-03-22 DIAGNOSIS — R32 Unspecified urinary incontinence: Secondary | ICD-10-CM | POA: Diagnosis not present

## 2019-03-22 DIAGNOSIS — F0390 Unspecified dementia without behavioral disturbance: Secondary | ICD-10-CM | POA: Diagnosis not present

## 2019-03-22 DIAGNOSIS — M6281 Muscle weakness (generalized): Secondary | ICD-10-CM | POA: Diagnosis not present

## 2019-03-26 DIAGNOSIS — R296 Repeated falls: Secondary | ICD-10-CM | POA: Diagnosis not present

## 2019-03-26 DIAGNOSIS — M6281 Muscle weakness (generalized): Secondary | ICD-10-CM | POA: Diagnosis not present

## 2019-03-26 DIAGNOSIS — F0390 Unspecified dementia without behavioral disturbance: Secondary | ICD-10-CM | POA: Diagnosis not present

## 2019-03-26 DIAGNOSIS — Z23 Encounter for immunization: Secondary | ICD-10-CM | POA: Diagnosis not present

## 2019-03-26 DIAGNOSIS — U071 COVID-19: Secondary | ICD-10-CM | POA: Diagnosis not present

## 2019-03-28 DIAGNOSIS — F0390 Unspecified dementia without behavioral disturbance: Secondary | ICD-10-CM | POA: Diagnosis not present

## 2019-03-28 DIAGNOSIS — U071 COVID-19: Secondary | ICD-10-CM | POA: Diagnosis not present

## 2019-03-28 DIAGNOSIS — M6281 Muscle weakness (generalized): Secondary | ICD-10-CM | POA: Diagnosis not present

## 2019-03-28 DIAGNOSIS — R296 Repeated falls: Secondary | ICD-10-CM | POA: Diagnosis not present

## 2019-04-02 DIAGNOSIS — U071 COVID-19: Secondary | ICD-10-CM | POA: Diagnosis not present

## 2019-04-02 DIAGNOSIS — F0390 Unspecified dementia without behavioral disturbance: Secondary | ICD-10-CM | POA: Diagnosis not present

## 2019-04-02 DIAGNOSIS — R296 Repeated falls: Secondary | ICD-10-CM | POA: Diagnosis not present

## 2019-04-02 DIAGNOSIS — M6281 Muscle weakness (generalized): Secondary | ICD-10-CM | POA: Diagnosis not present

## 2019-04-03 DIAGNOSIS — F0390 Unspecified dementia without behavioral disturbance: Secondary | ICD-10-CM | POA: Diagnosis not present

## 2019-04-03 DIAGNOSIS — R296 Repeated falls: Secondary | ICD-10-CM | POA: Diagnosis not present

## 2019-04-03 DIAGNOSIS — M6281 Muscle weakness (generalized): Secondary | ICD-10-CM | POA: Diagnosis not present

## 2019-04-03 DIAGNOSIS — U071 COVID-19: Secondary | ICD-10-CM | POA: Diagnosis not present

## 2019-04-11 DIAGNOSIS — M79674 Pain in right toe(s): Secondary | ICD-10-CM | POA: Diagnosis not present

## 2019-04-11 DIAGNOSIS — B351 Tinea unguium: Secondary | ICD-10-CM | POA: Diagnosis not present

## 2019-04-11 DIAGNOSIS — M79675 Pain in left toe(s): Secondary | ICD-10-CM | POA: Diagnosis not present

## 2019-04-22 DIAGNOSIS — L259 Unspecified contact dermatitis, unspecified cause: Secondary | ICD-10-CM | POA: Diagnosis not present

## 2019-04-22 DIAGNOSIS — K219 Gastro-esophageal reflux disease without esophagitis: Secondary | ICD-10-CM | POA: Diagnosis not present

## 2019-04-23 DIAGNOSIS — Z23 Encounter for immunization: Secondary | ICD-10-CM | POA: Diagnosis not present

## 2019-05-20 DIAGNOSIS — K219 Gastro-esophageal reflux disease without esophagitis: Secondary | ICD-10-CM | POA: Diagnosis not present

## 2019-05-20 DIAGNOSIS — R32 Unspecified urinary incontinence: Secondary | ICD-10-CM | POA: Diagnosis not present

## 2019-05-22 DIAGNOSIS — U071 COVID-19: Secondary | ICD-10-CM | POA: Diagnosis not present

## 2019-05-22 DIAGNOSIS — Z20828 Contact with and (suspected) exposure to other viral communicable diseases: Secondary | ICD-10-CM | POA: Diagnosis not present

## 2019-06-04 DIAGNOSIS — Z20828 Contact with and (suspected) exposure to other viral communicable diseases: Secondary | ICD-10-CM | POA: Diagnosis not present

## 2019-06-04 DIAGNOSIS — U071 COVID-19: Secondary | ICD-10-CM | POA: Diagnosis not present

## 2019-06-11 DIAGNOSIS — U071 COVID-19: Secondary | ICD-10-CM | POA: Diagnosis not present

## 2019-06-11 DIAGNOSIS — Z20828 Contact with and (suspected) exposure to other viral communicable diseases: Secondary | ICD-10-CM | POA: Diagnosis not present

## 2019-06-12 DIAGNOSIS — M79675 Pain in left toe(s): Secondary | ICD-10-CM | POA: Diagnosis not present

## 2019-06-12 DIAGNOSIS — M79674 Pain in right toe(s): Secondary | ICD-10-CM | POA: Diagnosis not present

## 2019-06-12 DIAGNOSIS — B351 Tinea unguium: Secondary | ICD-10-CM | POA: Diagnosis not present

## 2019-06-20 DIAGNOSIS — L259 Unspecified contact dermatitis, unspecified cause: Secondary | ICD-10-CM | POA: Diagnosis not present

## 2019-06-20 DIAGNOSIS — K219 Gastro-esophageal reflux disease without esophagitis: Secondary | ICD-10-CM | POA: Diagnosis not present

## 2019-06-22 DIAGNOSIS — Z79899 Other long term (current) drug therapy: Secondary | ICD-10-CM | POA: Diagnosis not present

## 2019-07-18 ENCOUNTER — Ambulatory Visit: Payer: Medicaid Other | Admitting: Urology

## 2019-07-20 DIAGNOSIS — F0390 Unspecified dementia without behavioral disturbance: Secondary | ICD-10-CM | POA: Diagnosis not present

## 2019-07-20 DIAGNOSIS — K219 Gastro-esophageal reflux disease without esophagitis: Secondary | ICD-10-CM | POA: Diagnosis not present

## 2019-08-01 DIAGNOSIS — E43 Unspecified severe protein-calorie malnutrition: Secondary | ICD-10-CM | POA: Diagnosis not present

## 2019-08-01 DIAGNOSIS — F0390 Unspecified dementia without behavioral disturbance: Secondary | ICD-10-CM | POA: Diagnosis not present

## 2019-08-01 DIAGNOSIS — K219 Gastro-esophageal reflux disease without esophagitis: Secondary | ICD-10-CM | POA: Diagnosis not present

## 2019-08-01 DIAGNOSIS — R32 Unspecified urinary incontinence: Secondary | ICD-10-CM | POA: Diagnosis not present

## 2019-08-20 ENCOUNTER — Other Ambulatory Visit: Payer: Self-pay

## 2019-08-20 ENCOUNTER — Encounter: Payer: Self-pay | Admitting: Urology

## 2019-08-20 ENCOUNTER — Ambulatory Visit (INDEPENDENT_AMBULATORY_CARE_PROVIDER_SITE_OTHER): Payer: Medicare Other | Admitting: Urology

## 2019-08-20 VITALS — BP 115/74 | HR 114 | Temp 98.4°F

## 2019-08-20 DIAGNOSIS — N3020 Other chronic cystitis without hematuria: Secondary | ICD-10-CM | POA: Insufficient documentation

## 2019-08-20 MED ORDER — MYRBETRIQ 25 MG PO TB24: 25.0000 mg | ORAL_TABLET | Freq: Every day | ORAL | 3 refills | Status: AC

## 2019-08-20 MED ORDER — NITROFURANTOIN MACROCRYSTAL 50 MG PO CAPS: 50.0000 mg | ORAL_CAPSULE | Freq: Every day | ORAL | 3 refills | Status: DC

## 2019-08-20 NOTE — Progress Notes (Signed)

## 2019-08-20 NOTE — Progress Notes (Signed)
08/20/2019 10:03 AM   Hoyle Barr [birth year] [id number]  Referring provider: Rosita Fire, MD [place],  [place] [id number]  Chronic cystitis  HPI: Ms Skluzacek is a 86yo here for followup for chronic cystitis. She is currently on nitrofurination 50mg  QHS. No UTIs since last visit in Nov 2020. She is mirabegron for UUI. She denies any significant LUTS. NO dysuria or hematuria.  Here records from [place] are as follows: I have chronic cystitis. HPI: Donyae Kohn is a 86 year-old female established patient who is here for chronic cystitis.  She does have a burning sensation when she urinates. She does not have to strain or bear down to start her urinary stream. She is not having problems getting her urine stream started. She is not currently having trouble urinating. She is not having problems with emptying her bladder well.   She is having problems with urinary control or incontinence. She is not urinating more frequently now than usual.   She does not have pelvic or rectal pain related to voiding.   10/18/2018: IN the past 6-9 months she has gotten 3 UTIs. no hospitalization for UTI. She uses 5 pads per day. She is oxybutynin 5mg  BID. She has dementia and has worsening confusion with her UTIs.   01/17/2019: No UTIs since last visit. UA is concerning for infection. She is on bactrim 2x per week.     PMH: Past Medical History:  Diagnosis Date  . Dementia ([place])   . GERD (gastroesophageal reflux disease)     Surgical History: Past Surgical History:  Procedure Laterality Date  . CATARACT EXTRACTION W/PHACO Right 03/04/2015   Procedure: CATARACT EXTRACTION PHACO AND INTRAOCULAR LENS PLACEMENT (IOC);  Surgeon: Rutherford Guys, MD;  Location: [facility];  Service: Ophthalmology;  Laterality: Right;  CDE:45.80    Home Medications:  Allergies as of 08/20/2019   No Known Allergies     Medication List       Accurate as of August 20, 2019 10:03 AM. If you have any questions,  ask your nurse or doctor.        STOP taking these medications   albuterol 108 (90 Base) MCG/ACT inhaler Commonly known as: VENTOLIN HFA Stopped by: Nicolette Bang, MD   cephALEXin 500 MG capsule Commonly known as: KEFLEX Stopped by: Nicolette Bang, MD     TAKE these medications   APAP 325 MG tablet Take 2 tablets (650 mg total) by mouth every 6 (six) hours as needed for mild pain or moderate pain.   aspirin EC 81 MG tablet Take 81 mg by mouth daily.   Biotene Dry Mouth Gel Place 1 application onto teeth at bedtime. As directed to brush teeth before bed   calcium-vitamin D 500-200 MG-UNIT tablet Commonly known as: OSCAL WITH D Take 1 tablet by mouth 3 (three) times daily.   Daily Vite Tabs Take 1 tablet by mouth daily.   Myrbetriq 25 MG Tb24 tablet Generic drug: mirabegron ER Take 25 mg by mouth daily.   nitrofurantoin 50 MG capsule Commonly known as: MACRODANTIN Take 50 mg by mouth daily.   Protonix 40 MG tablet Generic drug: pantoprazole Take 40 mg by mouth daily.   sennosides-docusate sodium 8.6-50 MG tablet Commonly known as: SENOKOT-S Take 1 tablet by mouth at bedtime.   silver sulfADIAZINE 1 % cream Commonly known as: SILVADENE Apply 1 application topically 2 (two) times daily as needed (to affected areas of the back).   trimethoprim 100 MG tablet Commonly known  as: TRIMPEX Take 100 mg by mouth at bedtime.       Allergies: No Known Allergies  Family History: No family history on file.  Social History:  reports that she has never smoked. She has never used smokeless tobacco. She reports that she does not drink alcohol and does not use drugs.  ROS: All other review of systems were reviewed and are negative except what is noted above in HPI  Physical Exam: BP 115/74   Pulse (!) 114   Temp 98.4 F (36.9 C)   Constitutional:  Alert and oriented, No acute distress. HEENT: [place] AT, moist mucus membranes.  Trachea midline, no  masses. Cardiovascular: No clubbing, cyanosis, or edema. Respiratory: Normal respiratory effort, no increased work of breathing. GI: Abdomen is soft, nontender, nondistended, no abdominal masses GU: No CVA tenderness.  Lymph: No cervical or inguinal lymphadenopathy. Skin: No rashes, bruises or suspicious lesions. Neurologic: Grossly intact, no focal deficits, moving all 4 extremities. Psychiatric: Normal mood and affect.  Laboratory Data: Lab Results  Component Value Date   WBC 10.6 (H) 02/17/2019   HGB 12.0 02/17/2019   HCT 37.8 02/17/2019   MCV 96.9 02/17/2019   PLT 463 (H) 02/17/2019    Lab Results  Component Value Date   CREATININE 0.94 02/17/2019    No results found for: PSA  No results found for: TESTOSTERONE  No results found for: HGBA1C  Urinalysis    Component Value Date/Time   COLORURINE YELLOW 02/17/2019 2332   APPEARANCEUR HAZY (A) 02/17/2019 2332   LABSPEC 1.018 02/17/2019 2332   PHURINE 6.0 02/17/2019 2332   GLUCOSEU NEGATIVE 02/17/2019 2332   HGBUR NEGATIVE 02/17/2019 2332   BILIRUBINUR NEGATIVE 02/17/2019 2332   KETONESUR NEGATIVE 02/17/2019 2332   PROTEINUR NEGATIVE 02/17/2019 2332   NITRITE NEGATIVE 02/17/2019 2332   LEUKOCYTESUR NEGATIVE 02/17/2019 2332    Lab Results  Component Value Date   BACTERIA FEW (A) 02/11/2019    Pertinent Imaging:  No results found for this or any previous visit.  No results found for this or any previous visit.  No results found for this or any previous visit.  No results found for this or any previous visit.  No results found for this or any previous visit.  No results found for this or any previous visit.  No results found for this or any previous visit.  No results found for this or any previous visit.   Assessment & Plan:    1. Chronic cystitis -COntinue nitrofurintoin 50mg   -RTC 1 year   No follow-ups on file.  Nicolette Bang, MD  [facility] [place]

## 2019-08-20 NOTE — Patient Instructions (Signed)
Asymptomatic Bacteriuria  Asymptomatic bacteriuria is the presence of a large number of bacteria in the urine without the usual symptoms of burning or frequent urination. What are the causes? This condition is caused by an increase in bacteria in the urine. This increase can be caused by:  Bacteria entering the urinary tract, such as during sex.  A blockage in the urinary tract, such as from kidney stones or a tumor.  Bladder problems that prevent the bladder from emptying. What increases the risk? You are more likely to develop this condition if:  You have diabetes mellitus.  You are an elderly adult, especially if you are also in a long-term care facility.  You are pregnant and in the first trimester.  You have kidney stones.  You are female.  You have had a kidney transplant.  You have a leaky kidney tube valve (reflux).  You had a urinary catheter for a long period of time. What are the signs or symptoms? There are no symptoms of this condition. How is this diagnosed? This condition is diagnosed with a urine test. Because this condition does not cause symptoms, it is usually diagnosed when a urine sample is taken to treat or diagnose another condition, such as pregnancy or kidney problems. Most women who are in their first trimester of pregnancy are screened for asymptomatic bacteriuria. How is this treated? Usually, treatment is not needed for this condition. Treating the condition can lead to other problems, such as a yeast infection or the growth of bacteria that do not respond to treatment (antibiotic-resistant bacteria). Some people, such as pregnant women and people with kidney transplants, do need treatment with antibiotic medicines to prevent kidney infection (pyelonephritis). In pregnant women, kidney infection can lead to premature labor, fetal growth restriction, or newborn death. Follow these instructions at home: Medicines  Take over-the-counter and prescription  medicines only as told by your health care provider.  If you were prescribed an antibiotic medicine, take it as told by your health care provider. Do not stop taking the antibiotic even if you start to feel better. General instructions  Monitor your condition for any changes.  Drink enough fluid to keep your urine clear or pale yellow.  Go to the bathroom more often to keep your bladder empty.  If you are female, keep the area around your vagina and rectum clean. Wipe yourself from front to back after urinating.  Keep all follow-up visits as told by your health care provider. This is important. Contact a health care provider if:  You notice any new symptoms, such as back pain or burning while urinating. Get help right away if:  You develop signs of an infection such as: ? A burning sensation when you urinate. ? Have pain when you urinate. ? Develop an intense need to urinate. ? Urinating more frequently. ? Back pain or pelvic pain. ? Fever or chills.  You have blood in your urine.  Your urine becomes discolored or cloudy.  Your urine smells bad.  You have severe pain that cannot be controlled with medicine. Summary  Asymptomatic bacteriuria is the presence of a large number of bacteria in the urine without the usual symptoms of burning or frequent urination.  Usually, treatment is not needed for this condition. Treating the condition can lead to other problems, such as too much yeast and the growth of antibiotic-resistant bacteria.  Some people, such as pregnant women and people with kidney transplants, do need treatment with antibiotic medicines to prevent kidney   infection (pyelonephritis).  If you were prescribed an antibiotic medicine, take it as told by your health care provider. Do not stop taking the antibiotic even if you start to feel better. This information is not intended to replace advice given to you by your health care provider. Make sure you discuss any  questions you have with your health care provider. Document Revised: 05/30/2018 Document Reviewed: 02/03/2016 Elsevier Patient Education  2020 [employer].  

## 2019-09-03 ENCOUNTER — Emergency Department (HOSPITAL_COMMUNITY)
Admission: EM | Admit: 2019-09-03 | Discharge: 2019-09-04 | Disposition: A | Discharge status: Skilled Nursing Facility | Payer: Medicare Other | Attending: Emergency Medicine | Admitting: Emergency Medicine

## 2019-09-03 ENCOUNTER — Emergency Department (HOSPITAL_COMMUNITY): Payer: Medicare Other

## 2019-09-03 DIAGNOSIS — Y999 Unspecified external cause status: Secondary | ICD-10-CM | POA: Diagnosis not present

## 2019-09-03 DIAGNOSIS — Z23 Encounter for immunization: Secondary | ICD-10-CM | POA: Insufficient documentation

## 2019-09-03 DIAGNOSIS — Y929 Unspecified place or not applicable: Secondary | ICD-10-CM | POA: Insufficient documentation

## 2019-09-03 DIAGNOSIS — Y939 Activity, unspecified: Secondary | ICD-10-CM | POA: Diagnosis not present

## 2019-09-03 DIAGNOSIS — S0181XA Laceration without foreign body of other part of head, initial encounter: Secondary | ICD-10-CM | POA: Diagnosis not present

## 2019-09-03 DIAGNOSIS — Z7982 Long term (current) use of aspirin: Secondary | ICD-10-CM | POA: Diagnosis not present

## 2019-09-03 DIAGNOSIS — W19XXXA Unspecified fall, initial encounter: Secondary | ICD-10-CM | POA: Diagnosis not present

## 2019-09-03 DIAGNOSIS — S01111A Laceration without foreign body of right eyelid and periocular area, initial encounter: Secondary | ICD-10-CM | POA: Diagnosis not present

## 2019-09-03 DIAGNOSIS — H7011 Chronic mastoiditis, right ear: Secondary | ICD-10-CM | POA: Diagnosis not present

## 2019-09-03 DIAGNOSIS — R9431 Abnormal electrocardiogram [ECG] [EKG]: Secondary | ICD-10-CM | POA: Diagnosis not present

## 2019-09-03 DIAGNOSIS — S0100XA Unspecified open wound of scalp, initial encounter: Secondary | ICD-10-CM | POA: Diagnosis not present

## 2019-09-03 DIAGNOSIS — S0101XA Laceration without foreign body of scalp, initial encounter: Secondary | ICD-10-CM | POA: Diagnosis not present

## 2019-09-03 DIAGNOSIS — R58 Hemorrhage, not elsewhere classified: Secondary | ICD-10-CM | POA: Diagnosis not present

## 2019-09-03 DIAGNOSIS — F0390 Unspecified dementia without behavioral disturbance: Secondary | ICD-10-CM

## 2019-09-03 DIAGNOSIS — R404 Transient alteration of awareness: Secondary | ICD-10-CM | POA: Diagnosis not present

## 2019-09-03 MED ORDER — POVIDONE-IODINE 10 % EX SOLN
CUTANEOUS | Status: DC | PRN
  Filled 2019-09-03: qty 15

## 2019-09-03 MED ORDER — LIDOCAINE-EPINEPHRINE (PF) 1 %-1:200000 IJ SOLN
10.0000 mL | Freq: Once | INTRAMUSCULAR | Status: AC
  Administered 2019-09-04: 10 mL via INTRADERMAL
  Filled 2019-09-03: qty 30

## 2019-09-03 MED ORDER — TETANUS-DIPHTH-ACELL PERTUSSIS 5-2.5-18.5 LF-MCG/0.5 IM SUSP
0.5000 mL | Freq: Once | INTRAMUSCULAR | Status: AC
  Administered 2019-09-04: 0.5 mL via INTRAMUSCULAR
  Filled 2019-09-03: qty 0.5

## 2019-09-03 NOTE — ED Provider Notes (Addendum)
[facility] EMERGENCY DEPARTMENT Provider Note   CSN: [id number] Arrival date & time: 09/03/19  1827     History Chief Complaint  Patient presents with  . Facial Laceration    Judy Stevenson is a 86 y.o. female.  Patient brought in by EMS unwitnessed fall with [facility].  Patient was found between bed and wall.  Patient seems to be baseline mental status.  Patient has a 2 cm laceration at the right eyebrow area.  Patient without any complaints at all.  Not clear whether tetanus is up-to-date so it will be updated here.  Will order head CT to rule out any intracranial injury.  According to patient's medication list she is not on any blood thinners.  Past medical history significant for dementia and reflux disease.  Patient able to stand with assistance.        Past Medical History:  Diagnosis Date  . Dementia ([place])   . GERD (gastroesophageal reflux disease)     Patient Active Problem List   Diagnosis Date Noted  . Chronic cystitis 08/20/2019    Past Surgical History:  Procedure Laterality Date  . CATARACT EXTRACTION W/PHACO Right 03/04/2015   Procedure: CATARACT EXTRACTION PHACO AND INTRAOCULAR LENS PLACEMENT (IOC);  Surgeon: Rutherford Guys, MD;  Location: [facility];  Service: Ophthalmology;  Laterality: Right;  CDE:45.80     OB History   No obstetric history on file.     No family history on file.  Social History   Tobacco Use  . Smoking status: Never Smoker  . Smokeless tobacco: Never Used  Substance Use Topics  . Alcohol use: No  . Drug use: No    Home Medications Prior to Admission medications   Medication Sig Start Date End Date Taking? Authorizing Provider  acetaminophen 325 MG tablet Take 2 tablets (650 mg total) by mouth every 6 (six) hours as needed for mild pain or moderate pain. 12/15/14  Yes Ward, Delice Bison, DO  aspirin EC 81 MG tablet Take 81 mg by mouth daily.   Yes [provider]  calcium-vitamin D (OSCAL WITH D) 500-200  MG-UNIT tablet Take 1 tablet by mouth 3 (three) times daily.   Yes [provider]  Dentifrices (BIOTENE DRY MOUTH) GEL Place 1 application onto teeth at bedtime. As directed to brush teeth before bed   Yes [provider]  dextromethorphan-guaiFENesin (TUSSIN DM) 10-100 MG/5ML liquid Take 5 mLs by mouth every 6 (six) hours as needed for cough.   Yes [provider]  Multiple Vitamin (DAILY VITE) TABS Take 1 tablet by mouth daily.    Yes [provider]  MYRBETRIQ 25 MG TB24 tablet Take 1 tablet (25 mg total) by mouth daily. 08/20/19  Yes McKenzie, Candee Furbish, MD  nitrofurantoin (MACRODANTIN) 50 MG capsule Take 1 capsule (50 mg total) by mouth daily. 08/20/19  Yes McKenzie, Candee Furbish, MD  pantoprazole (PROTONIX) 40 MG tablet Take 40 mg by mouth daily.   Yes [provider]  sennosides-docusate sodium (SENOKOT-S) 8.6-50 MG tablet Take 1 tablet by mouth at bedtime.   Yes [provider]  silver sulfADIAZINE (SILVADENE) 1 % cream Apply 1 application topically 2 (two) times daily as needed (to affected areas of the back).   Yes [provider]  trimethoprim (TRIMPEX) 100 MG tablet Take 100 mg by mouth at bedtime.  02/02/19  Yes [provider]    Allergies    Patient has no known allergies.  Review of Systems  Review of Systems  Unable to perform ROS: Dementia    Physical Exam Updated Vital Signs BP 111/81   Pulse 99   Temp 98.6 F (37 C) (Oral)   Resp 15   Ht 1.524 m (5')   Wt 53.8 kg   SpO2 96%   BMI 23.18 kg/m   Physical Exam Vitals and nursing note reviewed.  Constitutional:      General: She is not in acute distress.    Appearance: Normal appearance. She is well-developed.  HENT:     Head: Normocephalic.     Comments: 2 cm laceration right eyebrow area.  Bleeding controlled. Eyes:     Extraocular Movements: Extraocular movements intact.     Conjunctiva/sclera: Conjunctivae normal.     Pupils: Pupils are  equal, round, and reactive to light.  Neck:     Comments: No tenderness to palpation to posterior cervical spine.  Patient with free range of motion. Cardiovascular:     Rate and Rhythm: Normal rate and regular rhythm.     Heart sounds: No murmur heard.   Pulmonary:     Effort: Pulmonary effort is normal. No respiratory distress.     Breath sounds: Normal breath sounds.  Abdominal:     Palpations: Abdomen is soft.     Tenderness: There is no abdominal tenderness.  Musculoskeletal:        General: No deformity. Normal range of motion.     Cervical back: Normal range of motion and neck supple. No tenderness.     Comments: No tenderness to palpation to the thoracic or lumbar spine.  Good range of motion of lower extremities upper extremities no obvious injury or deformity.  Patient able to weight-bear.  Do not think there is any hip injury or pelvic injury.  Skin:    General: Skin is warm and dry.  Neurological:     Mental Status: She is alert. Mental status is at baseline.     ED Results / Procedures / Treatments   Labs (all labs ordered are listed, but only abnormal results are displayed) Labs Reviewed - No data to display  EKG EKG Interpretation  Date/Time:  Monday September 03 2019 18:44:58 EDT Ventricular Rate:  95 PR Interval:    QRS Duration: 97 QT Interval:  372 QTC Calculation: 468 R Axis:   -46 Text Interpretation: Sinus rhythm Prolonged PR interval LAD, consider left anterior fascicular block Abnormal R-wave progression, early transition Left ventricular hypertrophy Confirmed by Fredia Sorrow [phone]) on 09/03/2019 9:58:48 PM   Radiology CT Head Wo Contrast  Result Date: 09/03/2019 CLINICAL DATA:  86 year old female status post unwitnessed fall. EXAM: CT HEAD WITHOUT CONTRAST TECHNIQUE: Contiguous axial images were obtained from the base of the skull through the vertex without intravenous contrast. COMPARISON:  Head CT 02/17/2019. FINDINGS: Brain: Cerebral volume  remains normal for age. No ventriculomegaly. No midline shift, mass effect, or evidence of intracranial mass lesion. No acute intracranial hemorrhage identified. Stable gray-white matter differentiation throughout the brain. Mild to moderate for age scattered white matter hypodensity. No acute cortically based infarct or cortical encephalomalacia identified. Vascular: Mild Calcified atherosclerosis at the skull base. No suspicious intracranial vascular hyperdensity. Skull: Mild hyperostosis.  No acute osseous abnormality identified. Sinuses/Orbits: Chronic right tympanic cavity and mastoid opacification is stable. Other Visualized paranasal sinuses and mastoids are stable and well pneumatized. Other: Right forehead scalp laceration with soft tissue injury to the periosteum of the right frontal bone on series 3, image 33. Underlying bone appears intact.  Relatively mild associated scalp hematoma. Orbits soft tissues appears stable. IMPRESSION: 1. Right forehead scalp soft tissue injury appears to extend to the periosteum of the right frontal bone, but with no underlying fracture. 2. No acute intracranial abnormality. Stable non contrast CT appearance of the brain since December. Electronically Signed   By: Genevie Ann M.D.   On: 09/03/2019 22:50    Procedures Procedures (including critical care time)  Medications Ordered in ED Medications  Tdap (BOOSTRIX) injection 0.5 mL (has no administration in time range)    ED Course  I have reviewed the triage vital signs and the nursing notes.  Pertinent labs & imaging results that were available during my care of the patient were reviewed by me and considered in my medical decision making (see chart for details).    MDM Rules/Calculators/A&P                          Unwitnessed fall no evidence of any other trauma other than the laceration to the right eyebrow area.  Ental status wise she seems to be baseline.  Tetanus updated.  Patient not on blood  thinners.  Patient able to stand with assistance.  No pain with range of motion of lower extremities.  No concern for hip fracture.  No evidence of any trauma to the upper extremities.  No neck pain.  No back pain.  Laceration will be repaired by physician assistant.  Procedure note we put in by them.  CT head without any intracranial abnormalities.  No skull fracture.  There does seem to be some periosteum involvement of the right frontal bone from the laceration which is deep.  After laceration repair patient should be stable to return back to nursing facility.    Final Clinical Impression(s) / ED Diagnoses Final diagnoses:  Fall, initial encounter  Laceration of forehead, initial encounter  Dementia without behavioral disturbance, unspecified dementia type [facility])    Rx / DC Orders ED Discharge Orders    None       Fredia Sorrow, MD 09/03/19 2305   Medical screening examination/treatment/procedure(s) were conducted as a shared visit with non-physician practitioner(s) and myself.  I personally evaluated the patient during the encounter.  EKG Interpretation  Date/Time:  Monday September 03 2019 18:44:58 EDT Ventricular Rate:  95 PR Interval:    QRS Duration: 97 QT Interval:  372 QTC Calculation: 468 R Axis:   -46 Text Interpretation: Sinus rhythm Prolonged PR interval LAD, consider left anterior fascicular block Abnormal R-wave progression, early transition Left ventricular hypertrophy Confirmed by Fredia Sorrow [phone]) on 09/03/2019 9:58:48 PM     Fredia Sorrow, MD 09/04/19 0006

## 2019-09-03 NOTE — Discharge Instructions (Signed)
CT head without any brain injury or skull fracture.  Suture repair sutures to be removed in 5 to 7 days.  Keep wound dry for 24 hours.  Tetanus updated here.

## 2019-09-03 NOTE — ED Triage Notes (Signed)
Per [facility] pt had an unwitnessed fall at [facility]. Pt was found in between the bed and wall. Pt. Has 2 in

## 2019-09-04 DIAGNOSIS — S0181XA Laceration without foreign body of other part of head, initial encounter: Secondary | ICD-10-CM | POA: Diagnosis not present

## 2019-09-04 NOTE — ED Provider Notes (Signed)
   patient seen by Dr. Rogene Houston and I was asked to perform laceration repair.  This was my only involvement in this patient's care.    Pt has 3 cm laceration of the right eyebrow area.  Hemostasis obtained prior to wound closure.  No hematoma.      Judy Stevenson.Laceration Repair  Date/Time: 09/03/2019 11:50 PM Performed by: Kem Parkinson, PA-C Authorized by: Kem Parkinson, PA-C   Consent:    Consent obtained:  Verbal and emergent situation   Consent given by:  Patient   Risks discussed:  Infection, need for additional repair, pain, poor cosmetic result and poor wound healing   Alternatives discussed:  No treatment and delayed treatment Universal protocol:    Procedure explained and questions answered to patient or proxy's satisfaction: yes     Relevant documents present and verified: yes     Test results available and properly labeled: yes     Imaging studies available: yes     Required blood products, implants, devices, and special equipment available: yes     Site/side marked: yes     Immediately prior to procedure, a time out was called: yes     Patient identity confirmed:  Verbally with patient Anesthesia (see MAR for exact dosages):    Anesthesia method:  Local infiltration   Local anesthetic:  Lidocaine 1% WITH epi Laceration details:    Location:  Face   Face location:  R eyebrow   Length (cm):  3 Repair type:    Repair type:  Simple Pre-procedure details:    Preparation:  Patient was prepped and draped in usual sterile fashion Exploration:    Hemostasis achieved with:  Direct pressure   Wound exploration: entire depth of wound probed and visualized     Wound extent: no foreign bodies/material noted, no underlying fracture noted and no vascular damage noted     Contaminated: no   Treatment:    Area cleansed with:  Betadine   Amount of cleaning:  Standard   Irrigation solution:  Sterile saline   Irrigation method:  Syringe   Visualized foreign bodies/material removed:  no   Skin repair:    Repair method:  Sutures   Suture size:  5-0   Suture material:  Prolene   Suture technique:  Simple interrupted   Number of sutures:  7 Approximation:    Approximation:  Close Post-procedure details:    Dressing:  Adhesive bandage   Patient tolerance of procedure:  Tolerated well, no immediate complications        Kem Parkinson, PA-C 09/04/19 0014    Fredia Sorrow, MD 09/05/19 1649

## 2019-09-04 NOTE — ED Notes (Signed)
Called [facility] to see if transport was coming to get pt. Staff stated they have left several messages for transport and was waiting for a call back.

## 2019-09-06 DIAGNOSIS — H25812 Combined forms of age-related cataract, left eye: Secondary | ICD-10-CM | POA: Diagnosis not present

## 2019-09-11 DIAGNOSIS — R2681 Unsteadiness on feet: Secondary | ICD-10-CM | POA: Diagnosis not present

## 2019-09-11 DIAGNOSIS — S0181XD Laceration without foreign body of other part of head, subsequent encounter: Secondary | ICD-10-CM | POA: Diagnosis not present

## 2019-09-11 DIAGNOSIS — R296 Repeated falls: Secondary | ICD-10-CM | POA: Diagnosis not present

## 2019-09-18 DIAGNOSIS — N3010 Interstitial cystitis (chronic) without hematuria: Secondary | ICD-10-CM | POA: Diagnosis not present

## 2019-09-18 DIAGNOSIS — R3981 Functional urinary incontinence: Secondary | ICD-10-CM | POA: Diagnosis not present

## 2019-09-18 DIAGNOSIS — F0390 Unspecified dementia without behavioral disturbance: Secondary | ICD-10-CM | POA: Diagnosis not present

## 2019-09-18 DIAGNOSIS — R296 Repeated falls: Secondary | ICD-10-CM | POA: Diagnosis not present

## 2019-09-18 DIAGNOSIS — R2681 Unsteadiness on feet: Secondary | ICD-10-CM | POA: Diagnosis not present

## 2019-09-18 DIAGNOSIS — S0990XD Unspecified injury of head, subsequent encounter: Secondary | ICD-10-CM | POA: Diagnosis not present

## 2019-09-20 DIAGNOSIS — R2681 Unsteadiness on feet: Secondary | ICD-10-CM | POA: Diagnosis not present

## 2019-09-20 DIAGNOSIS — R3981 Functional urinary incontinence: Secondary | ICD-10-CM | POA: Diagnosis not present

## 2019-09-20 DIAGNOSIS — R296 Repeated falls: Secondary | ICD-10-CM | POA: Diagnosis not present

## 2019-09-20 DIAGNOSIS — F0390 Unspecified dementia without behavioral disturbance: Secondary | ICD-10-CM | POA: Diagnosis not present

## 2019-09-20 DIAGNOSIS — N3010 Interstitial cystitis (chronic) without hematuria: Secondary | ICD-10-CM | POA: Diagnosis not present

## 2019-09-20 DIAGNOSIS — S0990XD Unspecified injury of head, subsequent encounter: Secondary | ICD-10-CM | POA: Diagnosis not present

## 2019-09-25 DIAGNOSIS — R3981 Functional urinary incontinence: Secondary | ICD-10-CM | POA: Diagnosis not present

## 2019-09-25 DIAGNOSIS — S0990XD Unspecified injury of head, subsequent encounter: Secondary | ICD-10-CM | POA: Diagnosis not present

## 2019-09-25 DIAGNOSIS — F0390 Unspecified dementia without behavioral disturbance: Secondary | ICD-10-CM | POA: Diagnosis not present

## 2019-09-25 DIAGNOSIS — R2681 Unsteadiness on feet: Secondary | ICD-10-CM | POA: Diagnosis not present

## 2019-09-25 DIAGNOSIS — R296 Repeated falls: Secondary | ICD-10-CM | POA: Diagnosis not present

## 2019-09-25 DIAGNOSIS — N3010 Interstitial cystitis (chronic) without hematuria: Secondary | ICD-10-CM | POA: Diagnosis not present

## 2019-09-27 DIAGNOSIS — N3010 Interstitial cystitis (chronic) without hematuria: Secondary | ICD-10-CM | POA: Diagnosis not present

## 2019-09-27 DIAGNOSIS — R2681 Unsteadiness on feet: Secondary | ICD-10-CM | POA: Diagnosis not present

## 2019-09-27 DIAGNOSIS — F0390 Unspecified dementia without behavioral disturbance: Secondary | ICD-10-CM | POA: Diagnosis not present

## 2019-09-27 DIAGNOSIS — R3981 Functional urinary incontinence: Secondary | ICD-10-CM | POA: Diagnosis not present

## 2019-09-27 DIAGNOSIS — R296 Repeated falls: Secondary | ICD-10-CM | POA: Diagnosis not present

## 2019-09-27 DIAGNOSIS — S0990XD Unspecified injury of head, subsequent encounter: Secondary | ICD-10-CM | POA: Diagnosis not present

## 2019-10-08 DIAGNOSIS — S0990XD Unspecified injury of head, subsequent encounter: Secondary | ICD-10-CM | POA: Diagnosis not present

## 2019-10-08 DIAGNOSIS — R2681 Unsteadiness on feet: Secondary | ICD-10-CM | POA: Diagnosis not present

## 2019-10-08 DIAGNOSIS — R296 Repeated falls: Secondary | ICD-10-CM | POA: Diagnosis not present

## 2019-10-08 DIAGNOSIS — F0390 Unspecified dementia without behavioral disturbance: Secondary | ICD-10-CM | POA: Diagnosis not present

## 2019-10-08 DIAGNOSIS — R3981 Functional urinary incontinence: Secondary | ICD-10-CM | POA: Diagnosis not present

## 2019-10-08 DIAGNOSIS — N3010 Interstitial cystitis (chronic) without hematuria: Secondary | ICD-10-CM | POA: Diagnosis not present

## 2019-10-09 DIAGNOSIS — U071 COVID-19: Secondary | ICD-10-CM | POA: Diagnosis not present

## 2019-10-09 DIAGNOSIS — Z20828 Contact with and (suspected) exposure to other viral communicable diseases: Secondary | ICD-10-CM | POA: Diagnosis not present

## 2019-10-10 DIAGNOSIS — N3010 Interstitial cystitis (chronic) without hematuria: Secondary | ICD-10-CM | POA: Diagnosis not present

## 2019-10-10 DIAGNOSIS — S0990XD Unspecified injury of head, subsequent encounter: Secondary | ICD-10-CM | POA: Diagnosis not present

## 2019-10-10 DIAGNOSIS — R2681 Unsteadiness on feet: Secondary | ICD-10-CM | POA: Diagnosis not present

## 2019-10-10 DIAGNOSIS — R3981 Functional urinary incontinence: Secondary | ICD-10-CM | POA: Diagnosis not present

## 2019-10-10 DIAGNOSIS — F0390 Unspecified dementia without behavioral disturbance: Secondary | ICD-10-CM | POA: Diagnosis not present

## 2019-10-10 DIAGNOSIS — R296 Repeated falls: Secondary | ICD-10-CM | POA: Diagnosis not present

## 2019-10-12 DIAGNOSIS — K219 Gastro-esophageal reflux disease without esophagitis: Secondary | ICD-10-CM | POA: Diagnosis not present

## 2019-10-12 DIAGNOSIS — F0390 Unspecified dementia without behavioral disturbance: Secondary | ICD-10-CM | POA: Diagnosis not present

## 2019-10-15 DIAGNOSIS — F0390 Unspecified dementia without behavioral disturbance: Secondary | ICD-10-CM | POA: Diagnosis not present

## 2019-10-15 DIAGNOSIS — S0990XD Unspecified injury of head, subsequent encounter: Secondary | ICD-10-CM | POA: Diagnosis not present

## 2019-10-15 DIAGNOSIS — R2681 Unsteadiness on feet: Secondary | ICD-10-CM | POA: Diagnosis not present

## 2019-10-15 DIAGNOSIS — N3010 Interstitial cystitis (chronic) without hematuria: Secondary | ICD-10-CM | POA: Diagnosis not present

## 2019-10-15 DIAGNOSIS — R3981 Functional urinary incontinence: Secondary | ICD-10-CM | POA: Diagnosis not present

## 2019-10-15 DIAGNOSIS — R296 Repeated falls: Secondary | ICD-10-CM | POA: Diagnosis not present

## 2019-10-16 DIAGNOSIS — Z20828 Contact with and (suspected) exposure to other viral communicable diseases: Secondary | ICD-10-CM | POA: Diagnosis not present

## 2019-10-16 DIAGNOSIS — U071 COVID-19: Secondary | ICD-10-CM | POA: Diagnosis not present

## 2019-10-17 DIAGNOSIS — R2681 Unsteadiness on feet: Secondary | ICD-10-CM | POA: Diagnosis not present

## 2019-10-17 DIAGNOSIS — Z20828 Contact with and (suspected) exposure to other viral communicable diseases: Secondary | ICD-10-CM | POA: Diagnosis not present

## 2019-10-17 DIAGNOSIS — R296 Repeated falls: Secondary | ICD-10-CM | POA: Diagnosis not present

## 2019-10-17 DIAGNOSIS — N3010 Interstitial cystitis (chronic) without hematuria: Secondary | ICD-10-CM | POA: Diagnosis not present

## 2019-10-17 DIAGNOSIS — S0990XD Unspecified injury of head, subsequent encounter: Secondary | ICD-10-CM | POA: Diagnosis not present

## 2019-10-17 DIAGNOSIS — F0390 Unspecified dementia without behavioral disturbance: Secondary | ICD-10-CM | POA: Diagnosis not present

## 2019-10-17 DIAGNOSIS — U071 COVID-19: Secondary | ICD-10-CM | POA: Diagnosis not present

## 2019-10-17 DIAGNOSIS — R3981 Functional urinary incontinence: Secondary | ICD-10-CM | POA: Diagnosis not present

## 2019-10-18 DIAGNOSIS — N3010 Interstitial cystitis (chronic) without hematuria: Secondary | ICD-10-CM | POA: Diagnosis not present

## 2019-10-18 DIAGNOSIS — U071 COVID-19: Secondary | ICD-10-CM | POA: Diagnosis not present

## 2019-10-18 DIAGNOSIS — F0390 Unspecified dementia without behavioral disturbance: Secondary | ICD-10-CM | POA: Diagnosis not present

## 2019-10-18 DIAGNOSIS — R2681 Unsteadiness on feet: Secondary | ICD-10-CM | POA: Diagnosis not present

## 2019-10-18 DIAGNOSIS — Z20828 Contact with and (suspected) exposure to other viral communicable diseases: Secondary | ICD-10-CM | POA: Diagnosis not present

## 2019-10-18 DIAGNOSIS — S0990XD Unspecified injury of head, subsequent encounter: Secondary | ICD-10-CM | POA: Diagnosis not present

## 2019-10-18 DIAGNOSIS — R296 Repeated falls: Secondary | ICD-10-CM | POA: Diagnosis not present

## 2019-10-18 DIAGNOSIS — R3981 Functional urinary incontinence: Secondary | ICD-10-CM | POA: Diagnosis not present

## 2019-10-19 DIAGNOSIS — M79675 Pain in left toe(s): Secondary | ICD-10-CM | POA: Diagnosis not present

## 2019-10-19 DIAGNOSIS — M79674 Pain in right toe(s): Secondary | ICD-10-CM | POA: Diagnosis not present

## 2019-10-19 DIAGNOSIS — B351 Tinea unguium: Secondary | ICD-10-CM | POA: Diagnosis not present

## 2019-10-22 DIAGNOSIS — R3981 Functional urinary incontinence: Secondary | ICD-10-CM | POA: Diagnosis not present

## 2019-10-22 DIAGNOSIS — R296 Repeated falls: Secondary | ICD-10-CM | POA: Diagnosis not present

## 2019-10-22 DIAGNOSIS — S0990XD Unspecified injury of head, subsequent encounter: Secondary | ICD-10-CM | POA: Diagnosis not present

## 2019-10-22 DIAGNOSIS — N3010 Interstitial cystitis (chronic) without hematuria: Secondary | ICD-10-CM | POA: Diagnosis not present

## 2019-10-22 DIAGNOSIS — F0390 Unspecified dementia without behavioral disturbance: Secondary | ICD-10-CM | POA: Diagnosis not present

## 2019-10-22 DIAGNOSIS — R2681 Unsteadiness on feet: Secondary | ICD-10-CM | POA: Diagnosis not present

## 2019-10-23 DIAGNOSIS — R296 Repeated falls: Secondary | ICD-10-CM | POA: Diagnosis not present

## 2019-10-23 DIAGNOSIS — N3010 Interstitial cystitis (chronic) without hematuria: Secondary | ICD-10-CM | POA: Diagnosis not present

## 2019-10-23 DIAGNOSIS — R2681 Unsteadiness on feet: Secondary | ICD-10-CM | POA: Diagnosis not present

## 2019-10-23 DIAGNOSIS — Z20828 Contact with and (suspected) exposure to other viral communicable diseases: Secondary | ICD-10-CM | POA: Diagnosis not present

## 2019-10-23 DIAGNOSIS — U071 COVID-19: Secondary | ICD-10-CM | POA: Diagnosis not present

## 2019-10-23 DIAGNOSIS — R3981 Functional urinary incontinence: Secondary | ICD-10-CM | POA: Diagnosis not present

## 2019-10-23 DIAGNOSIS — F0390 Unspecified dementia without behavioral disturbance: Secondary | ICD-10-CM | POA: Diagnosis not present

## 2019-10-23 DIAGNOSIS — S0990XD Unspecified injury of head, subsequent encounter: Secondary | ICD-10-CM | POA: Diagnosis not present

## 2019-10-25 DIAGNOSIS — U071 COVID-19: Secondary | ICD-10-CM | POA: Diagnosis not present

## 2019-10-25 DIAGNOSIS — Z20828 Contact with and (suspected) exposure to other viral communicable diseases: Secondary | ICD-10-CM | POA: Diagnosis not present

## 2019-10-30 DIAGNOSIS — Z20828 Contact with and (suspected) exposure to other viral communicable diseases: Secondary | ICD-10-CM | POA: Diagnosis not present

## 2019-10-30 DIAGNOSIS — U071 COVID-19: Secondary | ICD-10-CM | POA: Diagnosis not present

## 2019-10-31 DIAGNOSIS — Z20828 Contact with and (suspected) exposure to other viral communicable diseases: Secondary | ICD-10-CM | POA: Diagnosis not present

## 2019-10-31 DIAGNOSIS — U071 COVID-19: Secondary | ICD-10-CM | POA: Diagnosis not present

## 2019-11-01 DIAGNOSIS — U071 COVID-19: Secondary | ICD-10-CM | POA: Diagnosis not present

## 2019-11-01 DIAGNOSIS — Z20828 Contact with and (suspected) exposure to other viral communicable diseases: Secondary | ICD-10-CM | POA: Diagnosis not present

## 2019-11-05 DIAGNOSIS — R278 Other lack of coordination: Secondary | ICD-10-CM | POA: Diagnosis not present

## 2019-11-05 DIAGNOSIS — F0391 Unspecified dementia with behavioral disturbance: Secondary | ICD-10-CM | POA: Diagnosis not present

## 2019-11-05 DIAGNOSIS — Z20828 Contact with and (suspected) exposure to other viral communicable diseases: Secondary | ICD-10-CM | POA: Diagnosis not present

## 2019-11-05 DIAGNOSIS — U071 COVID-19: Secondary | ICD-10-CM | POA: Diagnosis not present

## 2019-11-08 DIAGNOSIS — Z20828 Contact with and (suspected) exposure to other viral communicable diseases: Secondary | ICD-10-CM | POA: Diagnosis not present

## 2019-11-08 DIAGNOSIS — U071 COVID-19: Secondary | ICD-10-CM | POA: Diagnosis not present

## 2019-11-12 DIAGNOSIS — F0390 Unspecified dementia without behavioral disturbance: Secondary | ICD-10-CM | POA: Diagnosis not present

## 2019-11-12 DIAGNOSIS — K219 Gastro-esophageal reflux disease without esophagitis: Secondary | ICD-10-CM | POA: Diagnosis not present

## 2019-11-13 DIAGNOSIS — R278 Other lack of coordination: Secondary | ICD-10-CM | POA: Diagnosis not present

## 2019-11-13 DIAGNOSIS — Z20828 Contact with and (suspected) exposure to other viral communicable diseases: Secondary | ICD-10-CM | POA: Diagnosis not present

## 2019-11-13 DIAGNOSIS — F0391 Unspecified dementia with behavioral disturbance: Secondary | ICD-10-CM | POA: Diagnosis not present

## 2019-11-13 DIAGNOSIS — U071 COVID-19: Secondary | ICD-10-CM | POA: Diagnosis not present

## 2019-11-15 DIAGNOSIS — U071 COVID-19: Secondary | ICD-10-CM | POA: Diagnosis not present

## 2019-11-15 DIAGNOSIS — Z20828 Contact with and (suspected) exposure to other viral communicable diseases: Secondary | ICD-10-CM | POA: Diagnosis not present

## 2019-11-20 DIAGNOSIS — U071 COVID-19: Secondary | ICD-10-CM | POA: Diagnosis not present

## 2019-11-20 DIAGNOSIS — Z20828 Contact with and (suspected) exposure to other viral communicable diseases: Secondary | ICD-10-CM | POA: Diagnosis not present

## 2019-11-22 DIAGNOSIS — Z20828 Contact with and (suspected) exposure to other viral communicable diseases: Secondary | ICD-10-CM | POA: Diagnosis not present

## 2019-11-22 DIAGNOSIS — U071 COVID-19: Secondary | ICD-10-CM | POA: Diagnosis not present

## 2019-11-22 DIAGNOSIS — F0391 Unspecified dementia with behavioral disturbance: Secondary | ICD-10-CM | POA: Diagnosis not present

## 2019-11-22 DIAGNOSIS — R278 Other lack of coordination: Secondary | ICD-10-CM | POA: Diagnosis not present

## 2019-11-23 DIAGNOSIS — Z23 Encounter for immunization: Secondary | ICD-10-CM | POA: Diagnosis not present

## 2019-11-27 DIAGNOSIS — Z20828 Contact with and (suspected) exposure to other viral communicable diseases: Secondary | ICD-10-CM | POA: Diagnosis not present

## 2019-11-27 DIAGNOSIS — U071 COVID-19: Secondary | ICD-10-CM | POA: Diagnosis not present

## 2019-11-29 DIAGNOSIS — U071 COVID-19: Secondary | ICD-10-CM | POA: Diagnosis not present

## 2019-11-29 DIAGNOSIS — Z20828 Contact with and (suspected) exposure to other viral communicable diseases: Secondary | ICD-10-CM | POA: Diagnosis not present

## 2019-12-04 DIAGNOSIS — U071 COVID-19: Secondary | ICD-10-CM | POA: Diagnosis not present

## 2019-12-04 DIAGNOSIS — Z20828 Contact with and (suspected) exposure to other viral communicable diseases: Secondary | ICD-10-CM | POA: Diagnosis not present

## 2019-12-05 DIAGNOSIS — R278 Other lack of coordination: Secondary | ICD-10-CM | POA: Diagnosis not present

## 2019-12-05 DIAGNOSIS — F0391 Unspecified dementia with behavioral disturbance: Secondary | ICD-10-CM | POA: Diagnosis not present

## 2019-12-06 DIAGNOSIS — U071 COVID-19: Secondary | ICD-10-CM | POA: Diagnosis not present

## 2019-12-06 DIAGNOSIS — Z20828 Contact with and (suspected) exposure to other viral communicable diseases: Secondary | ICD-10-CM | POA: Diagnosis not present

## 2019-12-07 DIAGNOSIS — F0390 Unspecified dementia without behavioral disturbance: Secondary | ICD-10-CM | POA: Diagnosis not present

## 2019-12-07 DIAGNOSIS — J309 Allergic rhinitis, unspecified: Secondary | ICD-10-CM | POA: Diagnosis not present

## 2019-12-07 DIAGNOSIS — R2681 Unsteadiness on feet: Secondary | ICD-10-CM | POA: Diagnosis not present

## 2019-12-10 DIAGNOSIS — R278 Other lack of coordination: Secondary | ICD-10-CM | POA: Diagnosis not present

## 2019-12-10 DIAGNOSIS — F0391 Unspecified dementia with behavioral disturbance: Secondary | ICD-10-CM | POA: Diagnosis not present

## 2019-12-11 DIAGNOSIS — Z20828 Contact with and (suspected) exposure to other viral communicable diseases: Secondary | ICD-10-CM | POA: Diagnosis not present

## 2019-12-11 DIAGNOSIS — U071 COVID-19: Secondary | ICD-10-CM | POA: Diagnosis not present

## 2019-12-13 DIAGNOSIS — Z20828 Contact with and (suspected) exposure to other viral communicable diseases: Secondary | ICD-10-CM | POA: Diagnosis not present

## 2019-12-13 DIAGNOSIS — U071 COVID-19: Secondary | ICD-10-CM | POA: Diagnosis not present

## 2019-12-18 DIAGNOSIS — U071 COVID-19: Secondary | ICD-10-CM | POA: Diagnosis not present

## 2019-12-18 DIAGNOSIS — Z20828 Contact with and (suspected) exposure to other viral communicable diseases: Secondary | ICD-10-CM | POA: Diagnosis not present

## 2019-12-20 DIAGNOSIS — Z20828 Contact with and (suspected) exposure to other viral communicable diseases: Secondary | ICD-10-CM | POA: Diagnosis not present

## 2019-12-20 DIAGNOSIS — U071 COVID-19: Secondary | ICD-10-CM | POA: Diagnosis not present

## 2019-12-21 DIAGNOSIS — M79674 Pain in right toe(s): Secondary | ICD-10-CM | POA: Diagnosis not present

## 2019-12-21 DIAGNOSIS — M79675 Pain in left toe(s): Secondary | ICD-10-CM | POA: Diagnosis not present

## 2019-12-21 DIAGNOSIS — B351 Tinea unguium: Secondary | ICD-10-CM | POA: Diagnosis not present

## 2019-12-25 DIAGNOSIS — Z20828 Contact with and (suspected) exposure to other viral communicable diseases: Secondary | ICD-10-CM | POA: Diagnosis not present

## 2019-12-25 DIAGNOSIS — U071 COVID-19: Secondary | ICD-10-CM | POA: Diagnosis not present

## 2019-12-27 DIAGNOSIS — U071 COVID-19: Secondary | ICD-10-CM | POA: Diagnosis not present

## 2019-12-27 DIAGNOSIS — Z20828 Contact with and (suspected) exposure to other viral communicable diseases: Secondary | ICD-10-CM | POA: Diagnosis not present

## 2020-01-01 DIAGNOSIS — Z20828 Contact with and (suspected) exposure to other viral communicable diseases: Secondary | ICD-10-CM | POA: Diagnosis not present

## 2020-01-01 DIAGNOSIS — U071 COVID-19: Secondary | ICD-10-CM | POA: Diagnosis not present

## 2020-01-03 DIAGNOSIS — Z20828 Contact with and (suspected) exposure to other viral communicable diseases: Secondary | ICD-10-CM | POA: Diagnosis not present

## 2020-01-03 DIAGNOSIS — U071 COVID-19: Secondary | ICD-10-CM | POA: Diagnosis not present

## 2020-01-08 DIAGNOSIS — Z20828 Contact with and (suspected) exposure to other viral communicable diseases: Secondary | ICD-10-CM | POA: Diagnosis not present

## 2020-01-08 DIAGNOSIS — U071 COVID-19: Secondary | ICD-10-CM | POA: Diagnosis not present

## 2020-01-10 DIAGNOSIS — U071 COVID-19: Secondary | ICD-10-CM | POA: Diagnosis not present

## 2020-01-10 DIAGNOSIS — Z20828 Contact with and (suspected) exposure to other viral communicable diseases: Secondary | ICD-10-CM | POA: Diagnosis not present

## 2020-01-15 DIAGNOSIS — U071 COVID-19: Secondary | ICD-10-CM | POA: Diagnosis not present

## 2020-01-15 DIAGNOSIS — Z20828 Contact with and (suspected) exposure to other viral communicable diseases: Secondary | ICD-10-CM | POA: Diagnosis not present

## 2020-01-17 ENCOUNTER — Emergency Department (HOSPITAL_COMMUNITY)
Admission: EM | Admit: 2020-01-17 | Discharge: 2020-01-17 | Disposition: A | Discharge status: Home / Self Care | Payer: Medicare Other | Attending: Emergency Medicine | Admitting: Emergency Medicine

## 2020-01-17 ENCOUNTER — Other Ambulatory Visit: Payer: Self-pay

## 2020-01-17 ENCOUNTER — Emergency Department (HOSPITAL_COMMUNITY): Payer: Medicare Other

## 2020-01-17 DIAGNOSIS — I672 Cerebral atherosclerosis: Secondary | ICD-10-CM | POA: Diagnosis not present

## 2020-01-17 DIAGNOSIS — R4182 Altered mental status, unspecified: Secondary | ICD-10-CM | POA: Diagnosis not present

## 2020-01-17 DIAGNOSIS — F0390 Unspecified dementia without behavioral disturbance: Secondary | ICD-10-CM | POA: Insufficient documentation

## 2020-01-17 DIAGNOSIS — J90 Pleural effusion, not elsewhere classified: Secondary | ICD-10-CM | POA: Diagnosis not present

## 2020-01-17 DIAGNOSIS — R531 Weakness: Secondary | ICD-10-CM | POA: Diagnosis not present

## 2020-01-17 DIAGNOSIS — J9811 Atelectasis: Secondary | ICD-10-CM | POA: Diagnosis not present

## 2020-01-17 DIAGNOSIS — Z20822 Contact with and (suspected) exposure to covid-19: Secondary | ICD-10-CM | POA: Diagnosis not present

## 2020-01-17 DIAGNOSIS — R9431 Abnormal electrocardiogram [ECG] [EKG]: Secondary | ICD-10-CM | POA: Diagnosis not present

## 2020-01-17 DIAGNOSIS — R404 Transient alteration of awareness: Secondary | ICD-10-CM | POA: Diagnosis not present

## 2020-01-17 DIAGNOSIS — N3001 Acute cystitis with hematuria: Secondary | ICD-10-CM | POA: Diagnosis not present

## 2020-01-17 DIAGNOSIS — R410 Disorientation, unspecified: Secondary | ICD-10-CM | POA: Diagnosis not present

## 2020-01-17 DIAGNOSIS — R9082 White matter disease, unspecified: Secondary | ICD-10-CM | POA: Diagnosis not present

## 2020-01-17 LAB — CBC WITH DIFFERENTIAL/PLATELET
Abs Immature Granulocytes: 0.05 10*3/uL (ref 0.00–0.07)
Basophils Absolute: 0 10*3/uL (ref 0.0–0.1)
Basophils Relative: 0 %
Eosinophils Absolute: 0.2 10*3/uL (ref 0.0–0.5)
Eosinophils Relative: 2 %
HCT: 40.7 % (ref 36.0–46.0)
Hemoglobin: 13.4 g/dL (ref 12.0–15.0)
Immature Granulocytes: 1 %
Lymphocytes Relative: 11 %
Lymphs Abs: 1 10*3/uL (ref 0.7–4.0)
MCH: 29.7 pg (ref 26.0–34.0)
MCHC: 32.9 g/dL (ref 30.0–36.0)
MCV: 90.2 fL (ref 80.0–100.0)
Monocytes Absolute: 1.4 10*3/uL — ABNORMAL HIGH (ref 0.1–1.0)
Monocytes Relative: 16 %
Neutro Abs: 6.4 10*3/uL (ref 1.7–7.7)
Neutrophils Relative %: 70 %
Platelets: 296 10*3/uL (ref 150–400)
RBC: 4.51 MIL/uL (ref 3.87–5.11)
RDW: 14.3 % (ref 11.5–15.5)
WBC: 9.1 10*3/uL (ref 4.0–10.5)
nRBC: 0 % (ref 0.0–0.2)

## 2020-01-17 LAB — URINALYSIS, ROUTINE W REFLEX MICROSCOPIC
Bilirubin Urine: NEGATIVE
Glucose, UA: NEGATIVE mg/dL
Ketones, ur: 5 mg/dL — AB
Nitrite: NEGATIVE
Protein, ur: 30 mg/dL — AB
Specific Gravity, Urine: 1.009 (ref 1.005–1.030)
pH: 6 (ref 5.0–8.0)

## 2020-01-17 LAB — BASIC METABOLIC PANEL
Anion gap: 8 (ref 5–15)
BUN: 17 mg/dL (ref 8–23)
CO2: 23 mmol/L (ref 22–32)
Calcium: 9.3 mg/dL (ref 8.9–10.3)
Chloride: 96 mmol/L — ABNORMAL LOW (ref 98–111)
Creatinine, Ser: 0.79 mg/dL (ref 0.44–1.00)
GFR, Estimated: >60 mL/min (ref >60)
Glucose, Bld: 102 mg/dL — ABNORMAL HIGH (ref 70–99)
Potassium: 4.5 mmol/L (ref 3.5–5.1)
Sodium: 127 mmol/L — ABNORMAL LOW (ref 135–145)

## 2020-01-17 LAB — RESP PANEL BY RT-PCR (FLU A&B, COVID) ARPGX2
Influenza A by PCR: NEGATIVE
Influenza B by PCR: NEGATIVE
SARS Coronavirus 2 by RT PCR: NEGATIVE

## 2020-01-17 LAB — TROPONIN I (HIGH SENSITIVITY)
Troponin I (High Sensitivity): 7 ng/L (ref <18)
Troponin I (High Sensitivity): 7 ng/L (ref <18)

## 2020-01-17 MED ORDER — SODIUM CHLORIDE 0.9 % IV SOLN
1.0000 g | Freq: Once | INTRAVENOUS | Status: AC
  Administered 2020-01-17: 1 g via INTRAVENOUS
  Filled 2020-01-17: qty 10

## 2020-01-17 MED ORDER — SODIUM CHLORIDE 0.9 % IV BOLUS
1000.0000 mL | Freq: Once | INTRAVENOUS | Status: AC
  Administered 2020-01-17: 1000 mL via INTRAVENOUS

## 2020-01-17 NOTE — ED Notes (Signed)
Report given to Iline Oven at [facility].  [phone].

## 2020-01-17 NOTE — ED Notes (Signed)
EMS to bedside to transport patient back to facility.

## 2020-01-17 NOTE — Discharge Instructions (Addendum)
Discharge Instructions:  Judy Stevenson was worked up for some confusion in the ER today.  She had a brain CT which did not show signs of stroke or new injuries.  Her bloodwork showed no signs of sepsis, anemia, or dehydration.  Her EKG and cardiac blood tests were normal.  There was no emergency found on her workup, or indication for hospitalization at this time.  She may need physical therapy evaluation at your facility.  Her urine showed a POSSIBLE urine infection.  We will send a urine culture- and her PCP should follow up on this in 48 hours.  We gave her IV ceftriaxone in the ER today.  She can continue her home bactrim and macrobid as prescribed.  I would not add any extra antibiotics until there is a confirmed urine culture result.   Please contact her doctor or your facility medical providers tomorrow to discuss further care options.  I discussed with APS (her documented legal guardian per her facesheet) her future care.  Given her level of dementia and her frailty, I strongly recommended that they consider making her DNR/DNI in the future.  They said they would discuss this.  I recommend following up with them tomorrow regarding her advanced directive.

## 2020-01-17 NOTE — ED Triage Notes (Signed)
Pt to er via ems, per ems pt is from [facility] and was sent in because she has had some decline since yesterday, states that she normally gets around with a walker, but today she isn't answering questions and needed a wheel chair.

## 2020-01-17 NOTE — ED Notes (Signed)
Patient unable to sign for discharge instructions due to cognitive status.

## 2020-01-17 NOTE — ED Notes (Signed)
Rocephin administered per MD order.  Patient able to eat about 1/2 container of applesauce.

## 2020-01-17 NOTE — ED Triage Notes (Signed)
Unable to assess screening questions, pt not answering questions, pt in bed with eyes open and is looking around, pt follows commands, pt not answering questions.  Pt able to speak .

## 2020-01-17 NOTE — ED Provider Notes (Signed)
[facility] EMERGENCY DEPARTMENT Provider Note   CSN: [id number] Arrival date & time: 01/17/20  1600     History Chief Complaint  Patient presents with  . Altered Mental Status    Judy Stevenson is a 86 y.o. female w/ hx of advanced dementia, recurrent UTI's, presenting from [place] home with concern for confusion and somnolence today.  Per EMS she reportedly has had less energy than normal - usually can walk with a walker, but today was not getting up, was very sleepy and not answering questions.  No fevers noted.    On my exam the patient appears to be very hard of hearing.  She will wake up and states "nothing" hurts when I ask her if she's in pain.  However she cannot provide additional history.  I've made an attempt to contact her legal guardian per her facesheet and left a voicemail.  She has a Education officer, museum listed as a legal guardian.  Per medical records on arrival she is not on A/C.  No report of falls or head trauma.  Update 515 pm- I spoke to APS who are the patient's legal guardians [phone] who confirm that she has recurrent UTI's in the past.  They do not have a documented code status on file.  They confirm that they are her medical decision makers, and per my recommendation, will give consideration for filing an advanced directive, DNR/DNI, moving forward.  HPI     Past Medical History:  Diagnosis Date  . Dementia ([place])   . GERD (gastroesophageal reflux disease)     Patient Active Problem List   Diagnosis Date Noted  . Chronic cystitis 08/20/2019    Past Surgical History:  Procedure Laterality Date  . CATARACT EXTRACTION W/PHACO Right 03/04/2015   Procedure: CATARACT EXTRACTION PHACO AND INTRAOCULAR LENS PLACEMENT (IOC);  Surgeon: Rutherford Guys, MD;  Location: [facility];  Service: Ophthalmology;  Laterality: Right;  CDE:45.80     OB History   No obstetric history on file.     No family history on file.  Social History   Tobacco Use  .  Smoking status: Never Smoker  . Smokeless tobacco: Never Used  Substance Use Topics  . Alcohol use: No  . Drug use: No    Home Medications Prior to Admission medications   Medication Sig Start Date End Date Taking? Authorizing Provider  acetaminophen 325 MG tablet Take 2 tablets (650 mg total) by mouth every 6 (six) hours as needed for mild pain or moderate pain. 12/15/14  Yes Ward, Delice Bison, DO  aspirin EC 81 MG tablet Take 81 mg by mouth daily.   Yes [provider]  calcium-vitamin D (OSCAL WITH D) 500-200 MG-UNIT tablet Take 1 tablet by mouth 3 (three) times daily.   Yes [provider]  Dentifrices (BIOTENE DRY MOUTH) GEL Place 1 application onto teeth at bedtime. As directed to brush teeth before bed   Yes [provider]  dextromethorphan-guaiFENesin (TUSSIN DM) 10-100 MG/5ML liquid Take 5 mLs by mouth every 6 (six) hours as needed for cough.   Yes [provider]  Multiple Vitamin (DAILY VITE) TABS Take 1 tablet by mouth daily.    Yes [provider]  MYRBETRIQ 25 MG TB24 tablet Take 1 tablet (25 mg total) by mouth daily. 08/20/19  Yes McKenzie, Candee Furbish, MD  nitrofurantoin (MACRODANTIN) 50 MG capsule Take 1 capsule (50 mg total) by mouth daily. 08/20/19  Yes McKenzie, Candee Furbish, MD  pantoprazole (PROTONIX) 40 MG tablet Take 40 mg by mouth daily.   Yes [provider]  sennosides-docusate sodium (SENOKOT-S) 8.6-50 MG tablet Take 1 tablet by mouth at bedtime.   Yes [provider]  silver sulfADIAZINE (SILVADENE) 1 % cream Apply 1 application topically 2 (two) times daily as needed (to affected areas of the back).   Yes [provider]  trimethoprim (TRIMPEX) 100 MG tablet Take 100 mg by mouth at bedtime.  02/02/19  Yes [provider]    Allergies    Patient has no known allergies.  Review of Systems   Review of Systems  Reason unable to perform ROS: dementia - level 5 caveat.    Physical  Exam Updated Vital Signs BP 128/82 (BP Location: Right Arm)   Pulse 99   Temp 98.9 F (37.2 C) (Axillary)   Resp 16   Ht 5\' 2"  (1.575 m)   Wt 49.9 kg   SpO2 97%   BMI 20.12 kg/m   Physical Exam Vitals and nursing note reviewed.  Constitutional:      General: She is not in acute distress.    Appearance: She is well-developed.     Comments: Thin, chronically frail appearing, hard of hearing  HENT:     Head: Normocephalic and atraumatic.  Eyes:     Conjunctiva/sclera: Conjunctivae normal.  Cardiovascular:     Rate and Rhythm: Normal rate and regular rhythm.     Pulses: Normal pulses.  Pulmonary:     Effort: Pulmonary effort is normal. No respiratory distress.  Abdominal:     General: There is no distension.     Palpations: Abdomen is soft.     Tenderness: There is no abdominal tenderness. There is no guarding.  Musculoskeletal:        General: No tenderness or deformity.     Cervical back: Neck supple.     Comments: Full ROM of the bilateral hips and knees with no pain  Skin:    General: Skin is warm and dry.  Neurological:     Mental Status: She is alert.     Comments: Intermittently cooperates with commands Advanced dementia     ED Results / Procedures / Treatments   Labs (all labs ordered are listed, but only abnormal results are displayed) Labs Reviewed  BASIC METABOLIC PANEL - Abnormal; Notable for the following components:      Result Value   Sodium 127 (*)    Chloride 96 (*)    Glucose, Bld 102 (*)    All other components within normal limits  CBC WITH DIFFERENTIAL/PLATELET - Abnormal; Notable for the following components:   Monocytes Absolute 1.4 (*)    All other components within normal limits  URINALYSIS, ROUTINE W REFLEX MICROSCOPIC - Abnormal; Notable for the following components:   APPearance HAZY (*)    Hgb urine dipstick MODERATE (*)    Ketones, ur 5 (*)    Protein, ur 30 (*)    Leukocytes,Ua MODERATE (*)    All other components within  normal limits  RESP PANEL BY RT-PCR (FLU A&B, COVID) ARPGX2  URINE CULTURE  TROPONIN I (HIGH SENSITIVITY)  TROPONIN I (HIGH SENSITIVITY)    EKG None  Radiology CT Head Wo Contrast  Result Date: 01/17/2020 CLINICAL DATA:  Mental status changes of unknown cause. EXAM: CT HEAD WITHOUT CONTRAST TECHNIQUE: Contiguous axial images were obtained from the base of the skull through the vertex without intravenous contrast. COMPARISON:  09/03/2019 FINDINGS: Brain: Generalized age related  volume loss. No focal insult affects the brainstem or cerebellum. Cerebral hemispheres show moderate chronic small-vessel changes of white matter. No cortical or large vessel territory infarction. No mass lesion, hemorrhage, hydrocephalus or extra-axial collection. Vascular: There is atherosclerotic calcification of the major vessels at the base of the brain. Skull: Negative Sinuses/Orbits: Clear/normal Other: None IMPRESSION: No acute finding by CT. Age related atrophy and chronic small-vessel ischemic changes of the white matter. Electronically Signed   By: Nelson Chimes M.D.   On: 01/17/2020 17:33   DG Chest Portable 1 View  Result Date: 01/17/2020 CLINICAL DATA:  Confusion.  Mental status changes. EXAM: PORTABLE CHEST 1 VIEW COMPARISON:  02/11/2019 FINDINGS: Heart size within the limits of normal. Mild aortic atherosclerotic calcification. Left lung is clear. Mild linear scarring or atelectasis at the right lung base. No evidence of heart failure. No effusion. No acute bone finding. IMPRESSION: No active disease. Mild linear scarring or perhaps minimal atelectasis at the right lung base. Electronically Signed   By: Nelson Chimes M.D.   On: 01/17/2020 17:34    Procedures Procedures (including critical care time)  Medications Ordered in ED Medications  sodium chloride 0.9 % bolus 1,000 mL (0 mLs Intravenous Stopped 01/17/20 2132)  cefTRIAXone (ROCEPHIN) 1 g in sodium chloride 0.9 % 100 mL IVPB (0 g Intravenous  Stopped 01/17/20 2213)    ED Course  I have reviewed the triage vital signs and the nursing notes.  Pertinent labs & imaging results that were available during my care of the patient were reviewed by me and considered in my medical decision making (see chart for details).  This patient presents to the Emergency Department with complaint of altered mental status/confusion.  This involves an extensive number of treatment options, and is a complaint that carries with it a high risk of complications and morbidity.  The differential diagnosis includes recurrent cystitis vs metabolic encephalopathy vs stroke/ICH vs dementia vs atypical ACS vs other  Labs, [facility] pending Patient stable otherwise on arrival - doubt sepsis or shock.  I ordered, reviewed, and interpreted labs, including BMP (Na 127, Cl 96, otherwise unremarkable, Cr 0.79), WBC 9.1, Hgb 13.4, Covid/flu negative, Trop 7.  UA with blood, leuks, no nitrties I ordered medication IV NS for hyponatremia, IV rocephin for possible cystitis I ordered imaging studies which included dg chest and CT head  I independently visualized and interpreted imaging which showed no acute processes and the monitor tracing which showed sinus rhythm Additional history was obtained from APS - legal guardians as noted above Previous records obtained and reviewed showing recurrent cystitis: she is on macrobid and bactrim daily ppx. I personally reviewed the patients ECG which showed sinus rhythm with no acute ischemic findings  Workup most consistent with cystitis.  Treated her with one time dose of rocephin.  She is on macrobid at home.  I would let her PCP f/u on urine culture in 2 days rather than initiate more BS antibiotics at this time.  She is otherwise well appearing considering for her age and dementia, in no acute distress, and tolerating PO applesauce here.  Okay for discharge.  Clinical Course as of Jan 18 103  Thu Jan 17, 2020  1736 IMPRESSION: No  acute finding by CT. Age related atrophy and chronic small-vessel ischemic changes of the white matter.   [MT]    Clinical Course User Index [MT] Charina Fons, Carola Rhine, MD    Final Clinical Impression(s) / ED Diagnoses Final diagnoses:  Confusion  Acute  cystitis with hematuria    Rx / DC Orders ED Discharge Orders    None       Doretta Remmert, Carola Rhine, MD 01/18/20 [phone]

## 2020-01-18 ENCOUNTER — Encounter (HOSPITAL_COMMUNITY): Payer: Self-pay

## 2020-01-19 LAB — URINE CULTURE

## 2020-01-22 DIAGNOSIS — Z20828 Contact with and (suspected) exposure to other viral communicable diseases: Secondary | ICD-10-CM | POA: Diagnosis not present

## 2020-01-22 DIAGNOSIS — U071 COVID-19: Secondary | ICD-10-CM | POA: Diagnosis not present

## 2020-01-24 DIAGNOSIS — Z1331 Encounter for screening for depression: Secondary | ICD-10-CM | POA: Diagnosis not present

## 2020-01-24 DIAGNOSIS — K219 Gastro-esophageal reflux disease without esophagitis: Secondary | ICD-10-CM | POA: Diagnosis not present

## 2020-01-24 DIAGNOSIS — Z6822 Body mass index (BMI) 22.0-22.9, adult: Secondary | ICD-10-CM | POA: Diagnosis not present

## 2020-01-24 DIAGNOSIS — R2689 Other abnormalities of gait and mobility: Secondary | ICD-10-CM | POA: Diagnosis not present

## 2020-01-24 DIAGNOSIS — F0390 Unspecified dementia without behavioral disturbance: Secondary | ICD-10-CM | POA: Diagnosis not present

## 2020-01-24 DIAGNOSIS — Z20828 Contact with and (suspected) exposure to other viral communicable diseases: Secondary | ICD-10-CM | POA: Diagnosis not present

## 2020-01-24 DIAGNOSIS — Z1389 Encounter for screening for other disorder: Secondary | ICD-10-CM | POA: Diagnosis not present

## 2020-01-24 DIAGNOSIS — U071 COVID-19: Secondary | ICD-10-CM | POA: Diagnosis not present

## 2020-01-29 DIAGNOSIS — U071 COVID-19: Secondary | ICD-10-CM | POA: Diagnosis not present

## 2020-01-29 DIAGNOSIS — Z20828 Contact with and (suspected) exposure to other viral communicable diseases: Secondary | ICD-10-CM | POA: Diagnosis not present

## 2020-01-30 DIAGNOSIS — K219 Gastro-esophageal reflux disease without esophagitis: Secondary | ICD-10-CM | POA: Diagnosis not present

## 2020-01-30 DIAGNOSIS — E43 Unspecified severe protein-calorie malnutrition: Secondary | ICD-10-CM | POA: Diagnosis not present

## 2020-01-30 DIAGNOSIS — R2681 Unsteadiness on feet: Secondary | ICD-10-CM | POA: Diagnosis not present

## 2020-01-30 DIAGNOSIS — N390 Urinary tract infection, site not specified: Secondary | ICD-10-CM | POA: Diagnosis not present

## 2020-01-30 DIAGNOSIS — Z466 Encounter for fitting and adjustment of urinary device: Secondary | ICD-10-CM | POA: Diagnosis not present

## 2020-01-30 DIAGNOSIS — F0390 Unspecified dementia without behavioral disturbance: Secondary | ICD-10-CM | POA: Diagnosis not present

## 2020-01-30 DIAGNOSIS — N3021 Other chronic cystitis with hematuria: Secondary | ICD-10-CM | POA: Diagnosis not present

## 2020-01-30 DIAGNOSIS — R296 Repeated falls: Secondary | ICD-10-CM | POA: Diagnosis not present

## 2020-01-31 DIAGNOSIS — U071 COVID-19: Secondary | ICD-10-CM | POA: Diagnosis not present

## 2020-01-31 DIAGNOSIS — Z20828 Contact with and (suspected) exposure to other viral communicable diseases: Secondary | ICD-10-CM | POA: Diagnosis not present

## 2020-02-01 DIAGNOSIS — R2681 Unsteadiness on feet: Secondary | ICD-10-CM | POA: Diagnosis not present

## 2020-02-01 DIAGNOSIS — K219 Gastro-esophageal reflux disease without esophagitis: Secondary | ICD-10-CM | POA: Diagnosis not present

## 2020-02-01 DIAGNOSIS — Z466 Encounter for fitting and adjustment of urinary device: Secondary | ICD-10-CM | POA: Diagnosis not present

## 2020-02-01 DIAGNOSIS — N3021 Other chronic cystitis with hematuria: Secondary | ICD-10-CM | POA: Diagnosis not present

## 2020-02-01 DIAGNOSIS — F0390 Unspecified dementia without behavioral disturbance: Secondary | ICD-10-CM | POA: Diagnosis not present

## 2020-02-01 DIAGNOSIS — E43 Unspecified severe protein-calorie malnutrition: Secondary | ICD-10-CM | POA: Diagnosis not present

## 2020-02-04 DIAGNOSIS — K219 Gastro-esophageal reflux disease without esophagitis: Secondary | ICD-10-CM | POA: Diagnosis not present

## 2020-02-04 DIAGNOSIS — Z466 Encounter for fitting and adjustment of urinary device: Secondary | ICD-10-CM | POA: Diagnosis not present

## 2020-02-04 DIAGNOSIS — N3021 Other chronic cystitis with hematuria: Secondary | ICD-10-CM | POA: Diagnosis not present

## 2020-02-04 DIAGNOSIS — R2681 Unsteadiness on feet: Secondary | ICD-10-CM | POA: Diagnosis not present

## 2020-02-04 DIAGNOSIS — F0390 Unspecified dementia without behavioral disturbance: Secondary | ICD-10-CM | POA: Diagnosis not present

## 2020-02-04 DIAGNOSIS — R600 Localized edema: Secondary | ICD-10-CM | POA: Diagnosis not present

## 2020-02-04 DIAGNOSIS — R4181 Age-related cognitive decline: Secondary | ICD-10-CM | POA: Diagnosis not present

## 2020-02-04 DIAGNOSIS — E43 Unspecified severe protein-calorie malnutrition: Secondary | ICD-10-CM | POA: Diagnosis not present

## 2020-02-04 DIAGNOSIS — H9193 Unspecified hearing loss, bilateral: Secondary | ICD-10-CM | POA: Diagnosis not present

## 2020-02-05 DIAGNOSIS — Z20828 Contact with and (suspected) exposure to other viral communicable diseases: Secondary | ICD-10-CM | POA: Diagnosis not present

## 2020-02-05 DIAGNOSIS — U071 COVID-19: Secondary | ICD-10-CM | POA: Diagnosis not present

## 2020-02-07 DIAGNOSIS — Z20828 Contact with and (suspected) exposure to other viral communicable diseases: Secondary | ICD-10-CM | POA: Diagnosis not present

## 2020-02-07 DIAGNOSIS — U071 COVID-19: Secondary | ICD-10-CM | POA: Diagnosis not present

## 2020-02-10 DIAGNOSIS — N3021 Other chronic cystitis with hematuria: Secondary | ICD-10-CM | POA: Diagnosis not present

## 2020-02-10 DIAGNOSIS — Z466 Encounter for fitting and adjustment of urinary device: Secondary | ICD-10-CM | POA: Diagnosis not present

## 2020-02-10 DIAGNOSIS — R2681 Unsteadiness on feet: Secondary | ICD-10-CM | POA: Diagnosis not present

## 2020-02-10 DIAGNOSIS — F0390 Unspecified dementia without behavioral disturbance: Secondary | ICD-10-CM | POA: Diagnosis not present

## 2020-02-10 DIAGNOSIS — K219 Gastro-esophageal reflux disease without esophagitis: Secondary | ICD-10-CM | POA: Diagnosis not present

## 2020-02-10 DIAGNOSIS — E43 Unspecified severe protein-calorie malnutrition: Secondary | ICD-10-CM | POA: Diagnosis not present

## 2020-02-12 ENCOUNTER — Emergency Department (HOSPITAL_COMMUNITY)
Admission: EM | Admit: 2020-02-12 | Discharge: 2020-02-12 | Disposition: A | Discharge status: Home / Self Care | Payer: Medicare Other | Attending: Emergency Medicine | Admitting: Emergency Medicine

## 2020-02-12 ENCOUNTER — Emergency Department (HOSPITAL_COMMUNITY): Payer: Medicare Other

## 2020-02-12 ENCOUNTER — Encounter (HOSPITAL_COMMUNITY): Payer: Self-pay | Admitting: Emergency Medicine

## 2020-02-12 ENCOUNTER — Other Ambulatory Visit: Payer: Self-pay

## 2020-02-12 DIAGNOSIS — S0181XA Laceration without foreign body of other part of head, initial encounter: Secondary | ICD-10-CM | POA: Insufficient documentation

## 2020-02-12 DIAGNOSIS — W19XXXA Unspecified fall, initial encounter: Secondary | ICD-10-CM | POA: Diagnosis not present

## 2020-02-12 DIAGNOSIS — Y92129 Unspecified place in nursing home as the place of occurrence of the external cause: Secondary | ICD-10-CM | POA: Insufficient documentation

## 2020-02-12 DIAGNOSIS — M7989 Other specified soft tissue disorders: Secondary | ICD-10-CM | POA: Diagnosis not present

## 2020-02-12 DIAGNOSIS — F0390 Unspecified dementia without behavioral disturbance: Secondary | ICD-10-CM | POA: Diagnosis not present

## 2020-02-12 DIAGNOSIS — Z043 Encounter for examination and observation following other accident: Secondary | ICD-10-CM | POA: Diagnosis not present

## 2020-02-12 DIAGNOSIS — R600 Localized edema: Secondary | ICD-10-CM | POA: Diagnosis not present

## 2020-02-12 DIAGNOSIS — S8001XA Contusion of right knee, initial encounter: Secondary | ICD-10-CM | POA: Diagnosis not present

## 2020-02-12 DIAGNOSIS — N390 Urinary tract infection, site not specified: Secondary | ICD-10-CM | POA: Insufficient documentation

## 2020-02-12 DIAGNOSIS — R000 Tachycardia, unspecified: Secondary | ICD-10-CM | POA: Diagnosis not present

## 2020-02-12 DIAGNOSIS — R404 Transient alteration of awareness: Secondary | ICD-10-CM | POA: Diagnosis not present

## 2020-02-12 DIAGNOSIS — Z7982 Long term (current) use of aspirin: Secondary | ICD-10-CM | POA: Insufficient documentation

## 2020-02-12 DIAGNOSIS — R279 Unspecified lack of coordination: Secondary | ICD-10-CM | POA: Diagnosis not present

## 2020-02-12 DIAGNOSIS — S3993XA Unspecified injury of pelvis, initial encounter: Secondary | ICD-10-CM | POA: Diagnosis not present

## 2020-02-12 DIAGNOSIS — S0101XA Laceration without foreign body of scalp, initial encounter: Secondary | ICD-10-CM | POA: Diagnosis not present

## 2020-02-12 DIAGNOSIS — R0902 Hypoxemia: Secondary | ICD-10-CM | POA: Diagnosis not present

## 2020-02-12 LAB — CBC WITH DIFFERENTIAL/PLATELET
Abs Immature Granulocytes: 0.05 10*3/uL (ref 0.00–0.07)
Basophils Absolute: 0.1 10*3/uL (ref 0.0–0.1)
Basophils Relative: 1 %
Eosinophils Absolute: 0.3 10*3/uL (ref 0.0–0.5)
Eosinophils Relative: 4 %
HCT: 41.3 % (ref 36.0–46.0)
Hemoglobin: 13.7 g/dL (ref 12.0–15.0)
Immature Granulocytes: 1 %
Lymphocytes Relative: 9 %
Lymphs Abs: 0.7 10*3/uL (ref 0.7–4.0)
MCH: 29.6 pg (ref 26.0–34.0)
MCHC: 33.2 g/dL (ref 30.0–36.0)
MCV: 89.2 fL (ref 80.0–100.0)
Monocytes Absolute: 1.2 10*3/uL — ABNORMAL HIGH (ref 0.1–1.0)
Monocytes Relative: 14 %
Neutro Abs: 6 10*3/uL (ref 1.7–7.7)
Neutrophils Relative %: 71 %
Platelets: 319 10*3/uL (ref 150–400)
RBC: 4.63 MIL/uL (ref 3.87–5.11)
RDW: 14.4 % (ref 11.5–15.5)
WBC: 8.3 10*3/uL (ref 4.0–10.5)
nRBC: 0 % (ref 0.0–0.2)

## 2020-02-12 LAB — URINALYSIS, ROUTINE W REFLEX MICROSCOPIC
Bilirubin Urine: NEGATIVE
Glucose, UA: NEGATIVE mg/dL
Ketones, ur: 5 mg/dL — AB
Nitrite: NEGATIVE
Protein, ur: 100 mg/dL — AB
RBC / HPF: >50 RBC/hpf — ABNORMAL HIGH (ref 0–5)
Specific Gravity, Urine: 1.012 (ref 1.005–1.030)
WBC, UA: >50 WBC/hpf — ABNORMAL HIGH (ref 0–5)
pH: 6 (ref 5.0–8.0)

## 2020-02-12 LAB — COMPREHENSIVE METABOLIC PANEL
ALT: 71 U/L — ABNORMAL HIGH (ref 0–44)
AST: 76 U/L — ABNORMAL HIGH (ref 15–41)
Albumin: 3.4 g/dL — ABNORMAL LOW (ref 3.5–5.0)
Alkaline Phosphatase: 366 U/L — ABNORMAL HIGH (ref 38–126)
Anion gap: 11 (ref 5–15)
BUN: 17 mg/dL (ref 8–23)
CO2: 24 mmol/L (ref 22–32)
Calcium: 9.1 mg/dL (ref 8.9–10.3)
Chloride: 92 mmol/L — ABNORMAL LOW (ref 98–111)
Creatinine, Ser: 1.11 mg/dL — ABNORMAL HIGH (ref 0.44–1.00)
GFR, Estimated: 45 mL/min — ABNORMAL LOW (ref >60)
Glucose, Bld: 98 mg/dL (ref 70–99)
Potassium: 4.3 mmol/L (ref 3.5–5.1)
Sodium: 127 mmol/L — ABNORMAL LOW (ref 135–145)
Total Bilirubin: 0.7 mg/dL (ref 0.3–1.2)
Total Protein: 6.5 g/dL (ref 6.5–8.1)

## 2020-02-12 LAB — APTT: aPTT: 38 seconds — ABNORMAL HIGH (ref 24–36)

## 2020-02-12 LAB — D-DIMER, QUANTITATIVE: D-Dimer, Quant: 2.97 ug/mL-FEU — ABNORMAL HIGH (ref 0.00–0.50)

## 2020-02-12 LAB — PROTIME-INR
INR: 1 (ref 0.8–1.2)
Prothrombin Time: 12.5 seconds (ref 11.4–15.2)

## 2020-02-12 MED ORDER — CEPHALEXIN 500 MG PO CAPS
500.0000 mg | ORAL_CAPSULE | Freq: Once | ORAL | Status: AC
  Administered 2020-02-12: 05:00:00 500 mg via ORAL
  Filled 2020-02-12: qty 1

## 2020-02-12 MED ORDER — LIDOCAINE-EPINEPHRINE-TETRACAINE (LET) TOPICAL GEL
3.0000 mL | Freq: Once | TOPICAL | Status: AC
  Administered 2020-02-12: 03:00:00 3 mL via TOPICAL
  Filled 2020-02-12: qty 3

## 2020-02-12 MED ORDER — SODIUM CHLORIDE 0.9 % IV BOLUS
1000.0000 mL | Freq: Once | INTRAVENOUS | Status: AC
  Administered 2020-02-12: 06:00:00 1000 mL via INTRAVENOUS

## 2020-02-12 MED ORDER — CEPHALEXIN 500 MG PO CAPS: 500.0000 mg | ORAL_CAPSULE | Freq: Three times a day (TID) | ORAL | 0 refills | Status: AC

## 2020-02-12 NOTE — ED Notes (Signed)
Patient transported to CT via stretcher accompanied by Radiology staff.

## 2020-02-12 NOTE — ED Notes (Signed)
Called report to [place] at [facility]. Patient is to return to facility via EMS.

## 2020-02-12 NOTE — ED Provider Notes (Signed)
Care of the patient assumed at the change of shift pending [place] for concerns of LE DVT.   9:08 AM I spoke with Marinda Elk, Case Manager with DSS who is the patient's legal guardian. I informed her of sclerotic lesions on CT with concern for metastatic cancer and pending [place]. Will call her back with those results to discuss further management and how aggressive she would want to be with patient's workup/treatment.   10:39 AM DVT study is neg for DVT. Left a message with Kristine Garbe, DSS who will follow up with patient as an outpatient to arrange PCP follow up to determine extent to which a malignancy workup will be appropriate for her.    Truddie Hidden, MD 02/12/20 1040

## 2020-02-12 NOTE — ED Notes (Signed)
Handoff report given. Care transferred at this time.

## 2020-02-12 NOTE — ED Notes (Signed)
Pt left leg swollen and red, right knee bruised

## 2020-02-12 NOTE — ED Notes (Signed)
Lab to bedside for blood draw 

## 2020-02-12 NOTE — ED Provider Notes (Signed)
[facility] EMERGENCY DEPARTMENT Provider Note   CSN: [id number] Arrival date & time: 02/12/20  0234   Time seen 2:34 AM  History Chief Complaint  Patient presents with  . Fall   Level 5 caveat for dementia  Judy Stevenson is a 86 y.o. female.  HPI   Per EMS patient was at her nursing facility and was found on the floor next to her bed.  She had a unwitnessed fall.  They also reports she is nonambulatory.  EMS also reports she is extremely hard of hearing.  Patient has denied any injury.  PCP Rosita Fire, MD   Patient is under the care of DSS.  Her case manager is Marinda Elk phone number [phone] extension [phone].  After hours number [phone]  Past Medical History:  Diagnosis Date  . Dementia ([place])   . GERD (gastroesophageal reflux disease)     Patient Active Problem List   Diagnosis Date Noted  . Chronic cystitis 08/20/2019    Past Surgical History:  Procedure Laterality Date  . CATARACT EXTRACTION W/PHACO Right 03/04/2015   Procedure: CATARACT EXTRACTION PHACO AND INTRAOCULAR LENS PLACEMENT (IOC);  Surgeon: Rutherford Guys, MD;  Location: [facility];  Service: Ophthalmology;  Laterality: Right;  CDE:45.80     OB History   No obstetric history on file.     History reviewed. No pertinent family history.  Social History   Tobacco Use  . Smoking status: Never Smoker  . Smokeless tobacco: Never Used  Substance Use Topics  . Alcohol use: No  . Drug use: No  Lives in a nursing facility  Home Medications Prior to Admission medications   Medication Sig Start Date End Date Taking? Authorizing Provider  acetaminophen 325 MG tablet Take 2 tablets (650 mg total) by mouth every 6 (six) hours as needed for mild pain or moderate pain. 12/15/14   Ward, Delice Bison, DO  aspirin EC 81 MG tablet Take 81 mg by mouth daily.    [provider]  calcium-vitamin D (OSCAL WITH D) 500-200 MG-UNIT tablet Take 1 tablet by mouth 3 (three) times daily.    [provider]  Dentifrices (BIOTENE DRY MOUTH) GEL Place 1 application onto teeth at bedtime. As directed to brush teeth before bed    [provider]  dextromethorphan-guaiFENesin (TUSSIN DM) 10-100 MG/5ML liquid Take 5 mLs by mouth every 6 (six) hours as needed for cough.    [provider]  Multiple Vitamin (DAILY VITE) TABS Take 1 tablet by mouth daily.     [provider]  MYRBETRIQ 25 MG TB24 tablet Take 1 tablet (25 mg total) by mouth daily. 08/20/19   McKenzie, Candee Furbish, MD  nitrofurantoin (MACRODANTIN) 50 MG capsule Take 1 capsule (50 mg total) by mouth daily. 08/20/19   McKenzie, Candee Furbish, MD  pantoprazole (PROTONIX) 40 MG tablet Take 40 mg by mouth daily.    [provider]  sennosides-docusate sodium (SENOKOT-S) 8.6-50 MG tablet Take 1 tablet by mouth at bedtime.    [provider]  silver sulfADIAZINE (SILVADENE) 1 % cream Apply 1 application topically 2 (two) times daily as needed (to affected areas of the back).    [provider]  trimethoprim (TRIMPEX) 100 MG tablet Take 100 mg by mouth at bedtime.  02/02/19   [provider]    Allergies    Patient has no known allergies.  Review of Systems   Review of Systems  Unable to perform ROS: Dementia  Physical Exam Updated Vital Signs BP (!) 144/82   Pulse (!) 114   Temp 98 F (36.7 C) (Axillary)   Resp 14   Ht 5\' 2"  (1.575 m)   Wt 49.9 kg   SpO2 96%   BMI 20.12 kg/m   Physical Exam Vitals and nursing note reviewed.  Constitutional:      Comments: Frail elderly female who is cooperative  HENT:     Head: Normocephalic.     Comments: Patient has 1 superficial abrasion that is 1-1/2 cm in length and another very superficial abrasion that is less than a centimeter in size in her mid forehead. Eyes:     Extraocular Movements: Extraocular movements intact.     Conjunctiva/sclera: Conjunctivae normal.  Cardiovascular:     Rate and Rhythm: Normal rate and  regular rhythm.     Pulses: Normal pulses.     Heart sounds: Normal heart sounds.  Pulmonary:     Effort: Pulmonary effort is normal. No respiratory distress.     Breath sounds: Normal breath sounds.  Chest:     Chest wall: No tenderness.  Abdominal:     General: Abdomen is flat. Bowel sounds are normal.     Palpations: Abdomen is soft.  Musculoskeletal:     Cervical back: No tenderness.     Comments: Patient has diffuse swelling of her left lower leg with 2+ very soft pitting edema on the dorsum of her foot and ankle.  The leg is warm to touch.  There may be some mild erythema compared to the left. Patient has some bruising with minimal swelling of the anterior right knee however it does not appear painful to palpation and she can flex and extend the leg well.  Skin:    General: Skin is warm and dry.  Neurological:     General: No focal deficit present.     Mental Status: She is alert.     Cranial Nerves: No cranial nerve deficit.  Psychiatric:        Mood and Affect: Mood normal.        Behavior: Behavior normal.           ED Results / Procedures / Treatments   Labs (all labs ordered are listed, but only abnormal results are displayed) Results for orders placed or performed during the hospital encounter of 02/12/20  Comprehensive metabolic panel  Result Value Ref Range   Sodium 127 (L) 135 - 145 mmol/L   Potassium 4.3 3.5 - 5.1 mmol/L   Chloride 92 (L) 98 - 111 mmol/L   CO2 24 22 - 32 mmol/L   Glucose, Bld 98 70 - 99 mg/dL   BUN 17 8 - 23 mg/dL   Creatinine, Ser 1.11 (H) 0.44 - 1.00 mg/dL   Calcium 9.1 8.9 - 10.3 mg/dL   Total Protein 6.5 6.5 - 8.1 g/dL   Albumin 3.4 (L) 3.5 - 5.0 g/dL   AST 76 (H) 15 - 41 U/L   ALT 71 (H) 0 - 44 U/L   Alkaline Phosphatase 366 (H) 38 - 126 U/L   Total Bilirubin 0.7 0.3 - 1.2 mg/dL   GFR, Estimated 45 (L) >60 mL/min   Anion gap 11 5 - 15  CBC with Differential  Result Value Ref Range   WBC 8.3 4.0 - 10.5 K/uL   RBC 4.63 3.87  - 5.11 MIL/uL   Hemoglobin 13.7 12.0 - 15.0 g/dL   HCT 41.3 36.0 - 46.0 %   MCV 89.2  80.0 - 100.0 fL   MCH 29.6 26.0 - 34.0 pg   MCHC 33.2 30.0 - 36.0 g/dL   RDW 14.4 11.5 - 15.5 %   Platelets 319 150 - 400 K/uL   nRBC 0.0 0.0 - 0.2 %   Neutrophils Relative % 71 %   Neutro Abs 6.0 1.7 - 7.7 K/uL   Lymphocytes Relative 9 %   Lymphs Abs 0.7 0.7 - 4.0 K/uL   Monocytes Relative 14 %   Monocytes Absolute 1.2 (H) 0.1 - 1.0 K/uL   Eosinophils Relative 4 %   Eosinophils Absolute 0.3 0.0 - 0.5 K/uL   Basophils Relative 1 %   Basophils Absolute 0.1 0.0 - 0.1 K/uL   Immature Granulocytes 1 %   Abs Immature Granulocytes 0.05 0.00 - 0.07 K/uL  Urinalysis, Routine w reflex microscopic Urine, Catheterized  Result Value Ref Range   Color, Urine YELLOW YELLOW   APPearance HAZY (A) CLEAR   Specific Gravity, Urine 1.012 1.005 - 1.030   pH 6.0 5.0 - 8.0   Glucose, UA NEGATIVE NEGATIVE mg/dL   Hgb urine dipstick LARGE (A) NEGATIVE   Bilirubin Urine NEGATIVE NEGATIVE   Ketones, ur 5 (A) NEGATIVE mg/dL   Protein, ur 100 (A) NEGATIVE mg/dL   Nitrite NEGATIVE NEGATIVE   Leukocytes,Ua MODERATE (A) NEGATIVE   RBC / HPF >50 (H) 0 - 5 RBC/hpf   WBC, UA >50 (H) 0 - 5 WBC/hpf   Bacteria, UA RARE (A) NONE SEEN   Squamous Epithelial / LPF 0-5 0 - 5   WBC Clumps PRESENT    Hyaline Casts, UA PRESENT   D-dimer, quantitative  Result Value Ref Range   D-Dimer, Quant 2.97 (H) 0.00 - 0.50 ug/mL-FEU  APTT  Result Value Ref Range   aPTT 38 (H) 24 - 36 seconds  Protime-INR  Result Value Ref Range   Prothrombin Time 12.5 11.4 - 15.2 seconds   INR 1.0 0.8 - 1.2   Laboratory interpretation all normal except probable UTI, urine culture sent, elevation LFTs, hyponatremia which she has had before, new renal insufficiency    EKG EKG Interpretation  Date/Time:  Tuesday February 12 2020 02:44:13 EST Ventricular Rate:  118 PR Interval:    QRS Duration: 108 QT Interval:  338 QTC Calculation: 474 R  Axis:   -56 Text Interpretation: Sinus tachycardia Left anterior fascicular block Low voltage, precordial leads Consider anterior infarct Baseline wander in lead(s) V1 No significant change since last tracing 03 Sep 2019 Confirmed by Rolland Porter [phone]) on 02/12/2020 3:17:08 AM   Radiology DG Pelvis 1-2 Views  Result Date: 02/12/2020 CLINICAL DATA:  Initial evaluation for acute trauma, fall. EXAM: PELVIS - 1-2 VIEW COMPARISON:  Prior CT of the cervical spine performed on the same day. FINDINGS: No acute fracture or dislocation. No pubic diastasis. SI joints approximated symmetric. No acute abnormality about either hip. Underlying osteopenia. There are a few scattered sclerotic lesions seen involving the iliac wings bilaterally, concerning for possible metastatic disease. Degenerative changes noted within the lower lumbar spine. No visible soft tissue injury. Large volume retained stool noted impacted in the rectal vault, consistent with constipation. IMPRESSION: 1. No acute osseous abnormality about the pelvis. 2. Few scattered sclerotic lesions involving the iliac wings bilaterally, concerning for possible metastatic disease. Further assessment with dedicated bone scan suggested for further evaluation. 3. Large volume retained stool impacted in the rectal vault, consistent with constipation. Electronically Signed   By: Jeannine Boga M.D.   On: 02/12/2020 03:51  CT Head Wo Contrast  Result Date: 02/12/2020 CLINICAL DATA:  Unwitnessed fall. EXAM: CT HEAD WITHOUT CONTRAST TECHNIQUE: Contiguous axial images were obtained from the base of the skull through the vertex without intravenous contrast. COMPARISON:  January 17, 2020 and September 03, 2019 FINDINGS: Brain: There is mild cerebral atrophy with widening of the extra-axial spaces and ventricular dilatation. There are areas of decreased attenuation within the white matter tracts of the supratentorial brain, consistent with microvascular disease  changes. Vascular: No hyperdense vessel or unexpected calcification. Skull: Negative for an acute fracture. A 1.6 cm x 1.0 cm lytic area is seen within the frontal region on the right. This is not seen on the September 03, 2019 brain CT and is increased in size when compared to the January 17, 2020 study (measured approximately 0.1 cm x 0.7 cm on the prior exam). Sinuses/Orbits: No acute finding. Other: Mild frontal scalp soft tissue swelling is seen, along the midline. An associated scalp laceration is seen. IMPRESSION: 1. Mild frontal scalp soft tissue swelling and associated laceration without acute fracture or acute intracranial abnormality. 2. Interval increase in size of a lytic area within the right frontal region, as described above. While this may represent a benign process, a lytic metastasis cannot be excluded. MRI correlation is recommended. Electronically Signed   By: Aram Candelahaddeus  Houston M.D.   On: 02/12/2020 03:30   CT Cervical Spine Wo Contrast  Result Date: 02/12/2020 CLINICAL DATA:  Unwitnessed fall. EXAM: CT CERVICAL SPINE WITHOUT CONTRAST TECHNIQUE: Multidetector CT imaging of the cervical spine was performed without intravenous contrast. Multiplanar CT image reconstructions were also generated. COMPARISON:  February 17, 2019 FINDINGS: Alignment: Normal. Skull base and vertebrae: No acute fracture. A 6 mm diameter sclerotic focus is seen within the C5 vertebral body on the left. This represents a new finding when compared to the prior study. A similar appearing 4 mm sclerotic focus is seen within the lateral aspect of the C2 vertebral body on the left. There is also represents a new finding. Soft tissues and spinal canal: No prevertebral fluid or swelling. No visible canal hematoma. Disc levels: Moderate severity endplate sclerosis is seen at the levels of C5-C6 and C6-C7. Marked severity intervertebral disc space narrowing is seen at the levels of C5-C6 and C6-C7 with moderate to marked severity  posterior intervertebral disc space narrowing noted at the levels of C2-C3, C3-C4 and C4-C5. There is bilateral moderate severity multilevel facet joint hypertrophy. Upper chest: There is mild biapical scarring and/or atelectasis. A 6 mm sclerotic focus is seen within the distal aspect of the right clavicle. This region is not the imaged on the prior study. Other: None. IMPRESSION: 1. No evidence of an acute fracture within the cervical spine. 2. Marked severity multilevel degenerative changes, most prominent at the levels of C5-C6 and C6-C7. 3. New sclerotic lesions within the C2 and C5 vertebral bodies and distal aspect of the right clavicle. Further evaluation with a whole body nuclear medicine bone scan is recommended to evaluate for the presence of osseous metastasis. Electronically Signed   By: Aram Candelahaddeus  Houston M.D.   On: 02/12/2020 03:40    Procedures .Marland Kitchen.Laceration Repair  Date/Time: 02/12/2020 4:17 AM Performed by: Devoria AlbeKnapp, Lindsay Soulliere, MD Authorized by: Devoria AlbeKnapp, Hyun Marsalis, MD   Consent:    Consent obtained:  Emergent situation   Risks, benefits, and alternatives were discussed: not applicable   Universal protocol:    Immediately prior to procedure, a time out was called: yes     Patient identity  confirmed:  Arm band Anesthesia:    Anesthesia method:  Topical application   Topical anesthetic:  LET Laceration details:    Location:  Face   Face location:  Forehead   Length (cm):  1.5 Pre-procedure details:    Preparation:  Imaging obtained to evaluate for foreign bodies Exploration:    Hemostasis achieved with:  Direct pressure   Wound extent: no foreign bodies/material noted     Contaminated: no   Treatment:    Area cleansed with:  Saline   Amount of cleaning:  Standard Skin repair:    Repair method:  Tissue adhesive Repair type:    Repair type:  Simple Post-procedure details:    Dressing:  Open (no dressing)   Procedure completion:  Tolerated well, no immediate complications    (including critical care time)   Urine culture February 17, 2019 Component 12 mo ago  Specimen Description URINE, CATHETERIZED  Performed at [facility], [address] [place], [place] [address]   Special Requests NONE  Performed at [facility], [address], [place], [place] [address]   Culture 50,000 COLONIES/mL ESCHERICHIA COLIAbnormal   Report Status 02/20/2019 FINAL   Organism ID, Bacteria ESCHERICHIA COLIAbnormal   Resulting Agency [facility]      Susceptibility   Escherichia coli    MIC    AMPICILLIN >=32 RESIST... Resistant    AMPICILLIN/SULBACTAM >=32 RESIST... Resistant    CEFAZOLIN <=4 SENSITIVE  Sensitive    CEFTRIAXONE <=1 SENSITIVE  Sensitive    CIPROFLOXACIN >=4 RESISTANT  Resistant    GENTAMICIN >=16 RESIST... Resistant    IMIPENEM <=0.25 SENS... Sensitive    NITROFURANTOIN <=16 SENSIT... Sensitive    PIP/TAZO <=4 SENSITIVE  Sensitive    TRIMETH/SULFA >=320 RESIS... Resistant           Susceptibility Comments  Escherichia coli  50,000 COLONIES/mL ESCHERICHIA COLI      Specimen Collected: 02/17/19 23:32         Medications Ordered in ED Medications  lidocaine-EPINEPHrine-tetracaine (LET) topical gel (3 mLs Topical Given 02/12/20 0301)  cephALEXin (KEFLEX) capsule 500 mg (500 mg Oral Given 02/12/20 0453)  sodium chloride 0.9 % bolus 1,000 mL (1,000 mLs Intravenous New Bag/Given 02/12/20 0555)    ED Course  I have reviewed the triage vital signs and the nursing notes.  Pertinent labs & imaging results that were available during my care of the patient were reviewed by me and considered in my medical decision making (see chart for details).    MDM Rules/Calculators/A&P                          Due to some trauma of her forehead CT head and cervical spine was done.  AP pelvis was done although patient seems to have good range of motion of her hips and her knees.  She had a contusion and it does not appear to be painful and she has good  range of motion so x-rays were not obtained of her knee.  Routine laboratory testing was done and in addition a D-dimer was done to evaluate the swelling of her left leg.  Urinalysis was done.  Patient's urine has come back consistent with UTI.  When I look at her urine cultures the most recent one was multiple species, however a year ago she had E. coli with a lot of resistance however it was sensitive to the cephalosporins.  She was started on oral Keflex.  Patient's superficial  laceration on her forehead was Dermabond did.  Patient CT and x-ray show sclerotic and lytic lesions in her bones.  However at her age and clinical condition I am not sure that this needs to be aggressively followed up.  When I review patient's labs her creatinine has gone up from her blood work that was done just about a month ago.  She was given 1 L of IV fluids.  At change of shift patient is waiting to get a Doppler venous ultrasound done of her left leg.  Her D-dimer is elevated.  Concern is for DVT however she does have these abnormalities in her bones with both lytic and sclerotic lesions and minor elevation of her LFTs.  She could have an intra-abdominal cancer that has blocked her lymph system to the left leg.  However if her Doppler ultrasound is negative I am not sure that she needs any further testing in the hospital and can return to her facility.  Due to her age and her underlying dementia I do not think that aggressive evaluation needs to be done at this time.  6:27 AM I left a message at Marinda Elk' office with a request that she call back to get an update on her findings.  Patient left with Dr. Karle Starch at change of shift to get the results of her Doppler ultrasound.  Final Clinical Impression(s) / ED Diagnoses Final diagnoses:  Fall at nursing home, initial encounter  Laceration of forehead, initial encounter  Contusion of right knee, initial encounter  Swelling of left lower extremity  Urinary tract  infection with hematuria, site unspecified    Rx / DC Orders  Disposition pending  Rolland Porter, MD, Barbette Or, MD 02/12/20 0700

## 2020-02-12 NOTE — ED Triage Notes (Signed)
Pt arrives via [facility] from [facility]. Pt found on floor next to her bed. Pt has laceration to center of forehead. Pt denies pain. Pt HOH.

## 2020-02-13 LAB — URINE CULTURE

## 2020-02-14 DIAGNOSIS — W19XXXD Unspecified fall, subsequent encounter: Secondary | ICD-10-CM | POA: Diagnosis not present

## 2020-02-14 DIAGNOSIS — G939 Disorder of brain, unspecified: Secondary | ICD-10-CM | POA: Diagnosis not present

## 2020-02-14 DIAGNOSIS — Z20828 Contact with and (suspected) exposure to other viral communicable diseases: Secondary | ICD-10-CM | POA: Diagnosis not present

## 2020-02-14 DIAGNOSIS — R2689 Other abnormalities of gait and mobility: Secondary | ICD-10-CM | POA: Diagnosis not present

## 2020-02-14 DIAGNOSIS — U071 COVID-19: Secondary | ICD-10-CM | POA: Diagnosis not present

## 2020-02-19 DIAGNOSIS — E43 Unspecified severe protein-calorie malnutrition: Secondary | ICD-10-CM | POA: Diagnosis not present

## 2020-02-19 DIAGNOSIS — R2681 Unsteadiness on feet: Secondary | ICD-10-CM | POA: Diagnosis not present

## 2020-02-19 DIAGNOSIS — F0390 Unspecified dementia without behavioral disturbance: Secondary | ICD-10-CM | POA: Diagnosis not present

## 2020-02-19 DIAGNOSIS — U071 COVID-19: Secondary | ICD-10-CM | POA: Diagnosis not present

## 2020-02-19 DIAGNOSIS — N3021 Other chronic cystitis with hematuria: Secondary | ICD-10-CM | POA: Diagnosis not present

## 2020-02-19 DIAGNOSIS — Z466 Encounter for fitting and adjustment of urinary device: Secondary | ICD-10-CM | POA: Diagnosis not present

## 2020-02-19 DIAGNOSIS — Z20828 Contact with and (suspected) exposure to other viral communicable diseases: Secondary | ICD-10-CM | POA: Diagnosis not present

## 2020-02-19 DIAGNOSIS — K219 Gastro-esophageal reflux disease without esophagitis: Secondary | ICD-10-CM | POA: Diagnosis not present

## 2020-02-21 DIAGNOSIS — U071 COVID-19: Secondary | ICD-10-CM | POA: Diagnosis not present

## 2020-02-21 DIAGNOSIS — Z20828 Contact with and (suspected) exposure to other viral communicable diseases: Secondary | ICD-10-CM | POA: Diagnosis not present

## 2020-02-26 DIAGNOSIS — U071 COVID-19: Secondary | ICD-10-CM | POA: Diagnosis not present

## 2020-02-26 DIAGNOSIS — Z20828 Contact with and (suspected) exposure to other viral communicable diseases: Secondary | ICD-10-CM | POA: Diagnosis not present

## 2020-02-27 DIAGNOSIS — N390 Urinary tract infection, site not specified: Secondary | ICD-10-CM | POA: Diagnosis not present

## 2020-02-27 DIAGNOSIS — L239 Allergic contact dermatitis, unspecified cause: Secondary | ICD-10-CM | POA: Diagnosis not present

## 2020-02-27 DIAGNOSIS — G311 Senile degeneration of brain, not elsewhere classified: Secondary | ICD-10-CM | POA: Diagnosis not present

## 2020-02-27 DIAGNOSIS — K219 Gastro-esophageal reflux disease without esophagitis: Secondary | ICD-10-CM | POA: Diagnosis not present

## 2020-02-27 DIAGNOSIS — R531 Weakness: Secondary | ICD-10-CM | POA: Diagnosis not present

## 2020-02-28 DIAGNOSIS — G311 Senile degeneration of brain, not elsewhere classified: Secondary | ICD-10-CM | POA: Diagnosis not present

## 2020-02-28 DIAGNOSIS — U071 COVID-19: Secondary | ICD-10-CM | POA: Diagnosis not present

## 2020-02-28 DIAGNOSIS — N390 Urinary tract infection, site not specified: Secondary | ICD-10-CM | POA: Diagnosis not present

## 2020-02-28 DIAGNOSIS — Z20828 Contact with and (suspected) exposure to other viral communicable diseases: Secondary | ICD-10-CM | POA: Diagnosis not present

## 2020-02-28 DIAGNOSIS — K219 Gastro-esophageal reflux disease without esophagitis: Secondary | ICD-10-CM | POA: Diagnosis not present

## 2020-02-28 DIAGNOSIS — L239 Allergic contact dermatitis, unspecified cause: Secondary | ICD-10-CM | POA: Diagnosis not present

## 2020-02-28 DIAGNOSIS — R531 Weakness: Secondary | ICD-10-CM | POA: Diagnosis not present

## 2020-02-29 DIAGNOSIS — R531 Weakness: Secondary | ICD-10-CM | POA: Diagnosis not present

## 2020-02-29 DIAGNOSIS — K219 Gastro-esophageal reflux disease without esophagitis: Secondary | ICD-10-CM | POA: Diagnosis not present

## 2020-02-29 DIAGNOSIS — N390 Urinary tract infection, site not specified: Secondary | ICD-10-CM | POA: Diagnosis not present

## 2020-02-29 DIAGNOSIS — L239 Allergic contact dermatitis, unspecified cause: Secondary | ICD-10-CM | POA: Diagnosis not present

## 2020-02-29 DIAGNOSIS — G311 Senile degeneration of brain, not elsewhere classified: Secondary | ICD-10-CM | POA: Diagnosis not present

## 2020-03-03 DIAGNOSIS — R531 Weakness: Secondary | ICD-10-CM | POA: Diagnosis not present

## 2020-03-03 DIAGNOSIS — G311 Senile degeneration of brain, not elsewhere classified: Secondary | ICD-10-CM | POA: Diagnosis not present

## 2020-03-03 DIAGNOSIS — N390 Urinary tract infection, site not specified: Secondary | ICD-10-CM | POA: Diagnosis not present

## 2020-03-03 DIAGNOSIS — K219 Gastro-esophageal reflux disease without esophagitis: Secondary | ICD-10-CM | POA: Diagnosis not present

## 2020-03-03 DIAGNOSIS — L239 Allergic contact dermatitis, unspecified cause: Secondary | ICD-10-CM | POA: Diagnosis not present

## 2020-03-04 DIAGNOSIS — G311 Senile degeneration of brain, not elsewhere classified: Secondary | ICD-10-CM | POA: Diagnosis not present

## 2020-03-04 DIAGNOSIS — N390 Urinary tract infection, site not specified: Secondary | ICD-10-CM | POA: Diagnosis not present

## 2020-03-04 DIAGNOSIS — K219 Gastro-esophageal reflux disease without esophagitis: Secondary | ICD-10-CM | POA: Diagnosis not present

## 2020-03-04 DIAGNOSIS — L239 Allergic contact dermatitis, unspecified cause: Secondary | ICD-10-CM | POA: Diagnosis not present

## 2020-03-04 DIAGNOSIS — R531 Weakness: Secondary | ICD-10-CM | POA: Diagnosis not present

## 2020-03-25 DEATH — deceased

## 2020-08-20 ENCOUNTER — Ambulatory Visit: Payer: Medicaid Other | Admitting: Urology

## 2021-10-15 IMAGING — CT CT CERVICAL SPINE W/O CM
3 series · 14 of 33 positions shown, 17 images · non-contrast
Comparison: [id number]

CLINICAL DATA: Fall

EXAM:
CT CERVICAL SPINE WITHOUT CONTRAST
TECHNIQUE: Multidetector CT imaging of the cervical spine was performed without
intravenous contrast. Multiplanar CT image reconstructions were also
generated.

[Series 8: c spine soft · axial · 0.32mm/px · z∈[-85,+21]mm · 6 of 70 slices shown, 8 images]
[im 11/70  soft-tissue]
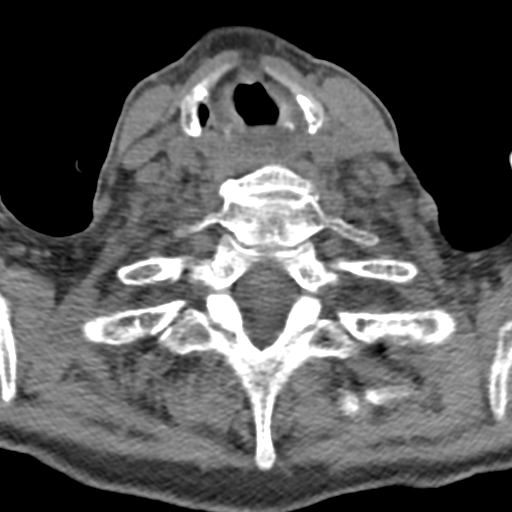
[im 11/70  bone]
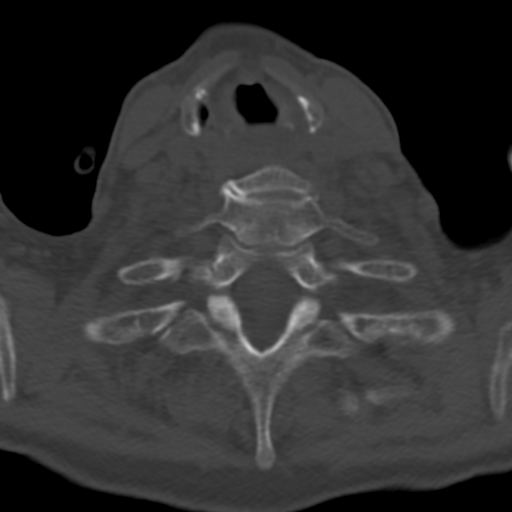
[im 22/70  bone]
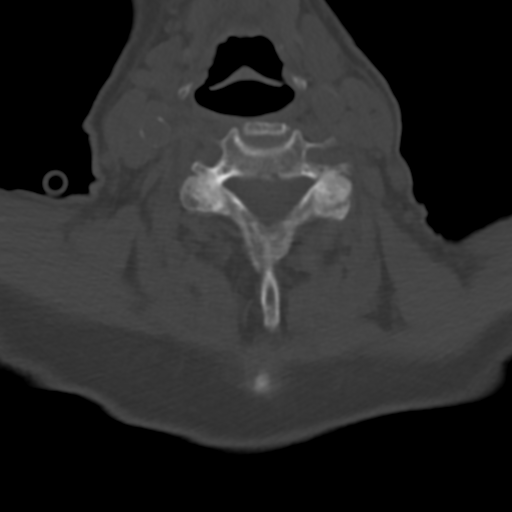
[im 32/70  bone]
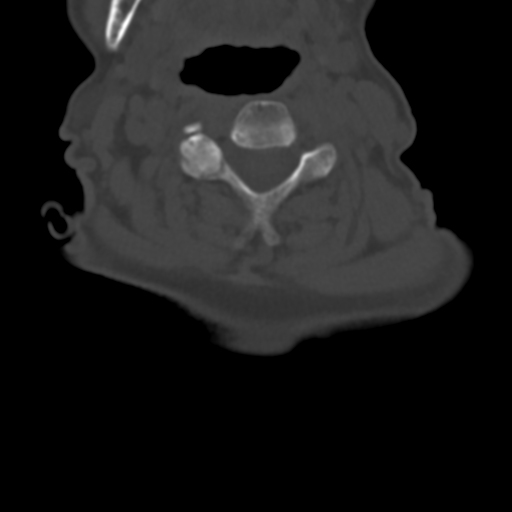
[im 43/70  bone]
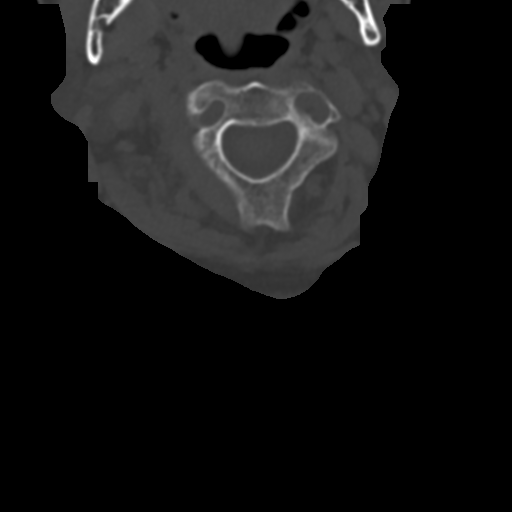
[im 54/70  soft-tissue]
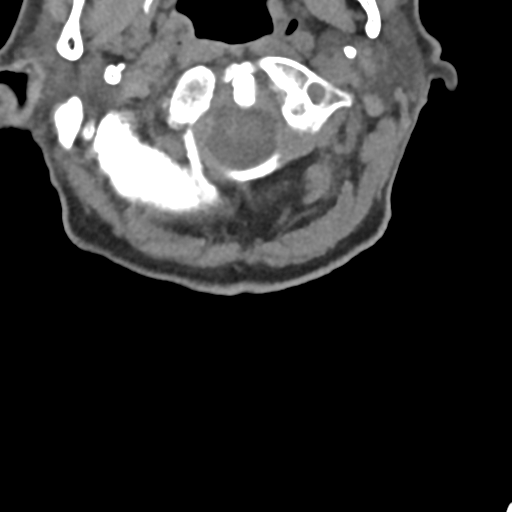
[im 54/70  bone]
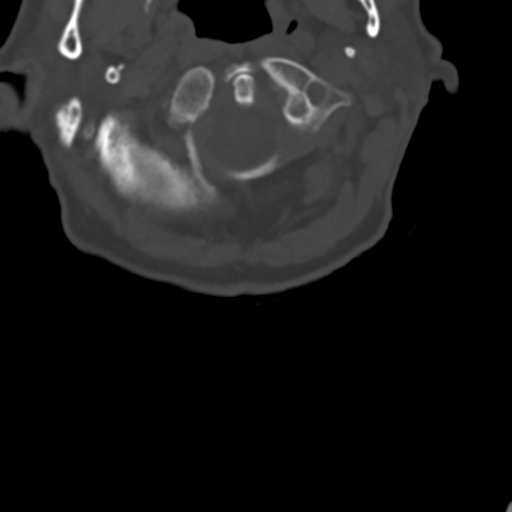
[im 64/70  bone]
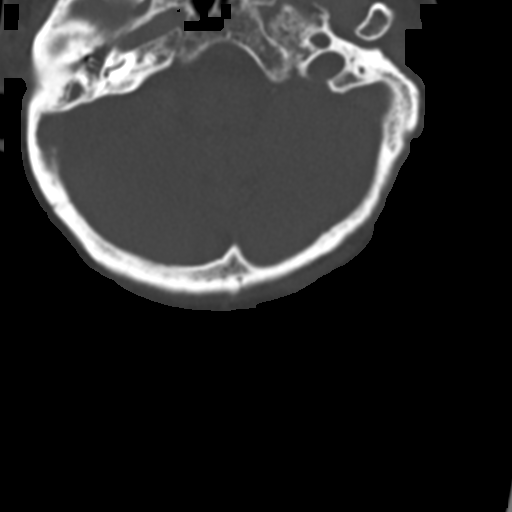

[Series 9: sagittal bone · sagittal · 0.29mm/px · 5 of 61 slices shown, 6 images]
[im 21/61  bone]
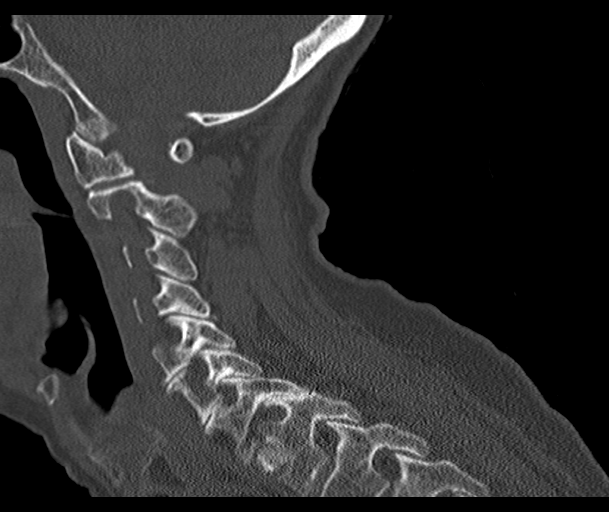
[im 26/61  bone]
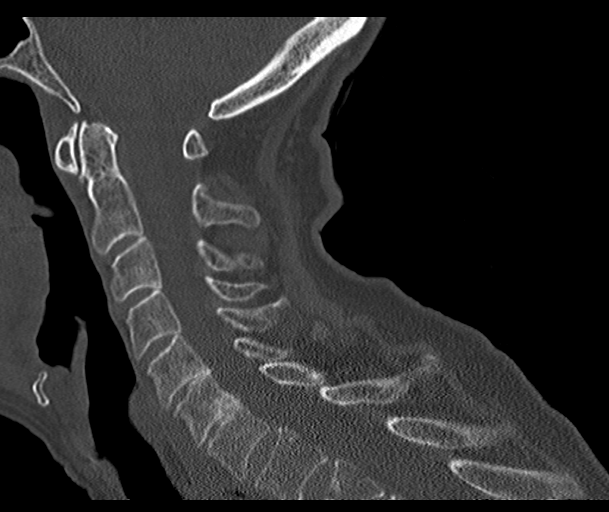
[im 31/61  soft-tissue]
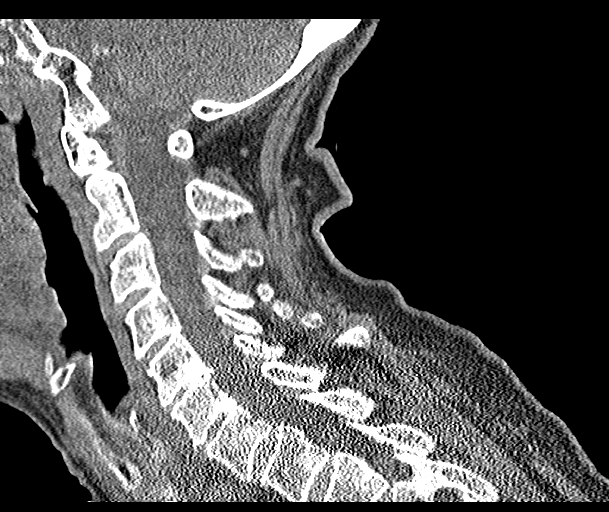
[im 31/61  bone]
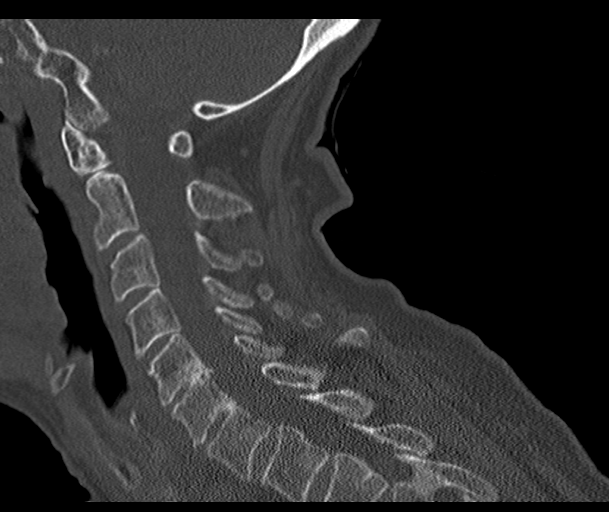
[im 36/61  bone]
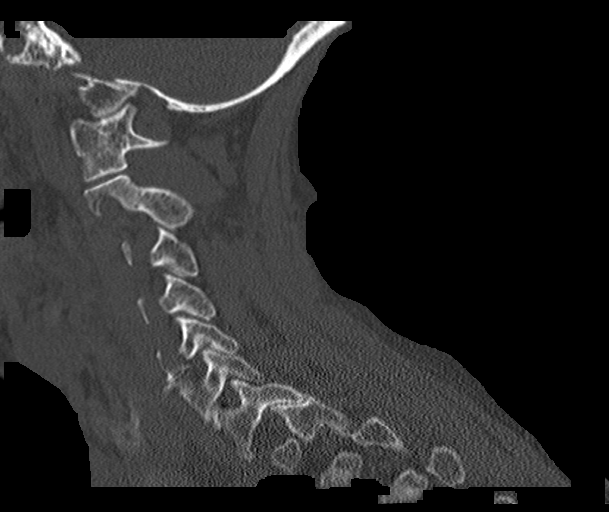
[im 41/61  bone]
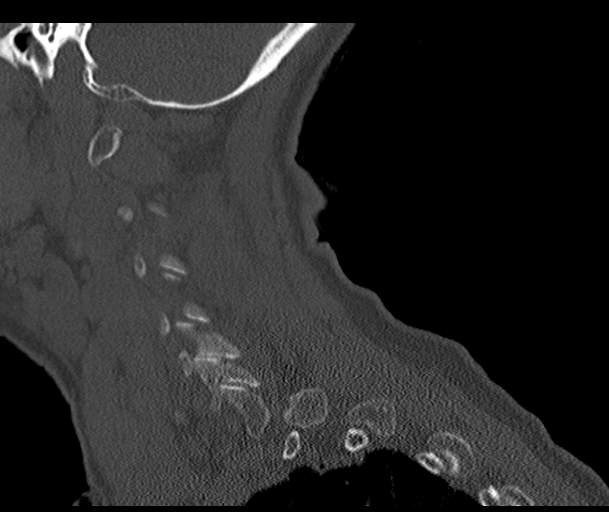

[Series 10: coronal bone · coronal · 0.23mm/px · 3 of 61 slices shown]
[im 18/61  bone]
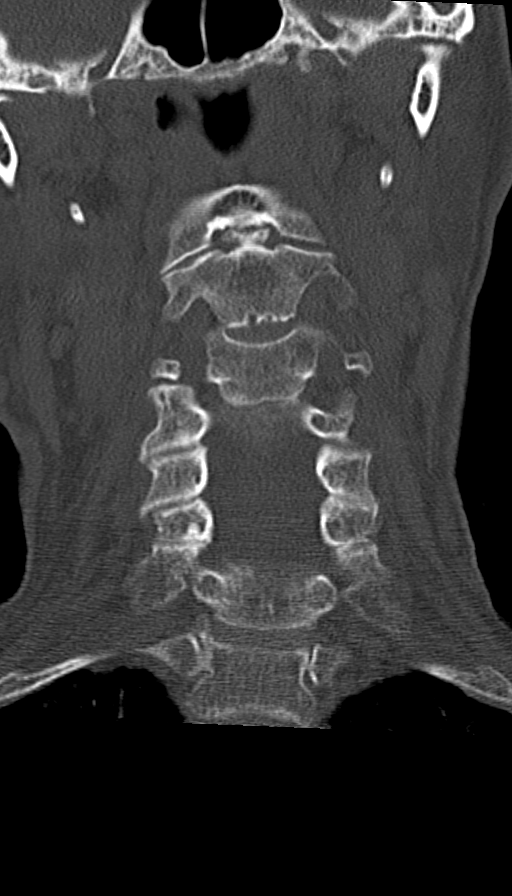
[im 26/61  bone]
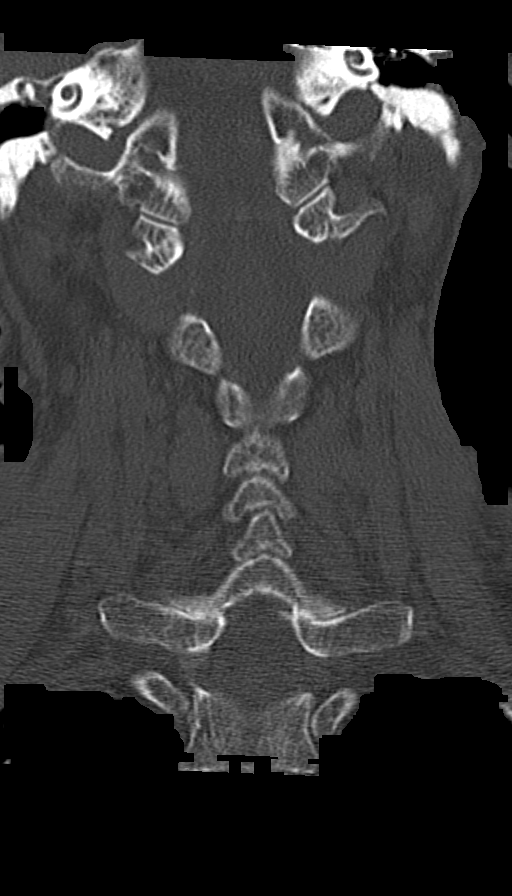
[im 35/61  bone]
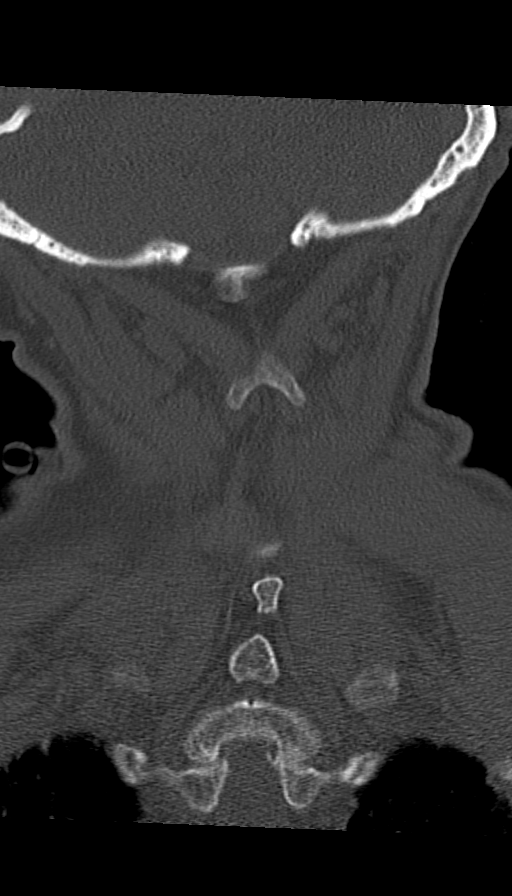

[14 of 33 positions shown; findings below may reference images not displayed]

FINDINGS: Alignment: No significant listhesis.

Skull base and vertebrae: No acute fracture. Vertebral body heights
are maintained.

Soft tissues and spinal canal: No prevertebral fluid or swelling. No
visible canal hematoma.

Disc levels: Multilevel degenerative changes are present including
disc space narrowing, endplate osteophytes, and facet and
uncovertebral hypertrophy. There is no high-grade osseous
encroachment on the spinal canal. Appearance is similar to the prior
study.

Upper chest: Biapical scarring.

Other: None.
IMPRESSION: No acute cervical spine fracture.
